# Patient Record
Sex: Female | Born: 1945 | Race: White | Hispanic: No | Marital: Married | Smoking: Never smoker
Health system: Southern US, Academic
[De-identification: ages and names within clinical notes are randomized; demographics above are authoritative.]

## PROBLEM LIST (undated history)

## (undated) DIAGNOSIS — N200 Calculus of kidney: Secondary | ICD-10-CM

## (undated) DIAGNOSIS — J189 Pneumonia, unspecified organism: Secondary | ICD-10-CM

## (undated) DIAGNOSIS — I10 Essential (primary) hypertension: Secondary | ICD-10-CM

## (undated) DIAGNOSIS — E782 Mixed hyperlipidemia: Secondary | ICD-10-CM

## (undated) DIAGNOSIS — C4490 Unspecified malignant neoplasm of skin, unspecified: Secondary | ICD-10-CM

## (undated) HISTORY — PX: HX BREAST BIOPSY: SHX20

## (undated) HISTORY — DX: Mixed hyperlipidemia: E78.2

## (undated) HISTORY — PX: SKIN CANCER EXCISION: SHX779

## (undated) HISTORY — PX: HEEL SPUR SURGERY: SHX665

## (undated) HISTORY — PX: HX TRIGGER FINGER RELEASE: 210000200

## (undated) HISTORY — PX: HX HYSTERECTOMY: SHX81

## (undated) HISTORY — DX: Essential (primary) hypertension: I10

## (undated) HISTORY — PX: NEPHROSTOMY: SHX1014

## (undated) HISTORY — PX: URETERAL STENT PLACEMENT: SHX822

---

## 1992-08-26 ENCOUNTER — Other Ambulatory Visit (HOSPITAL_COMMUNITY): Payer: Self-pay

## 2015-07-26 IMAGING — CR XRAY EXAM OF WRIST COMPLETE RT
1 series · 3 of 3 positions shown · non-contrast
Comparison: None.

Exam:   
Right wrist 3V
INDICATION: Trauma.

[Series 1: view not recorded · oblique · 0.17mm/px · 3 of 3 slices shown]
[im 1/3]
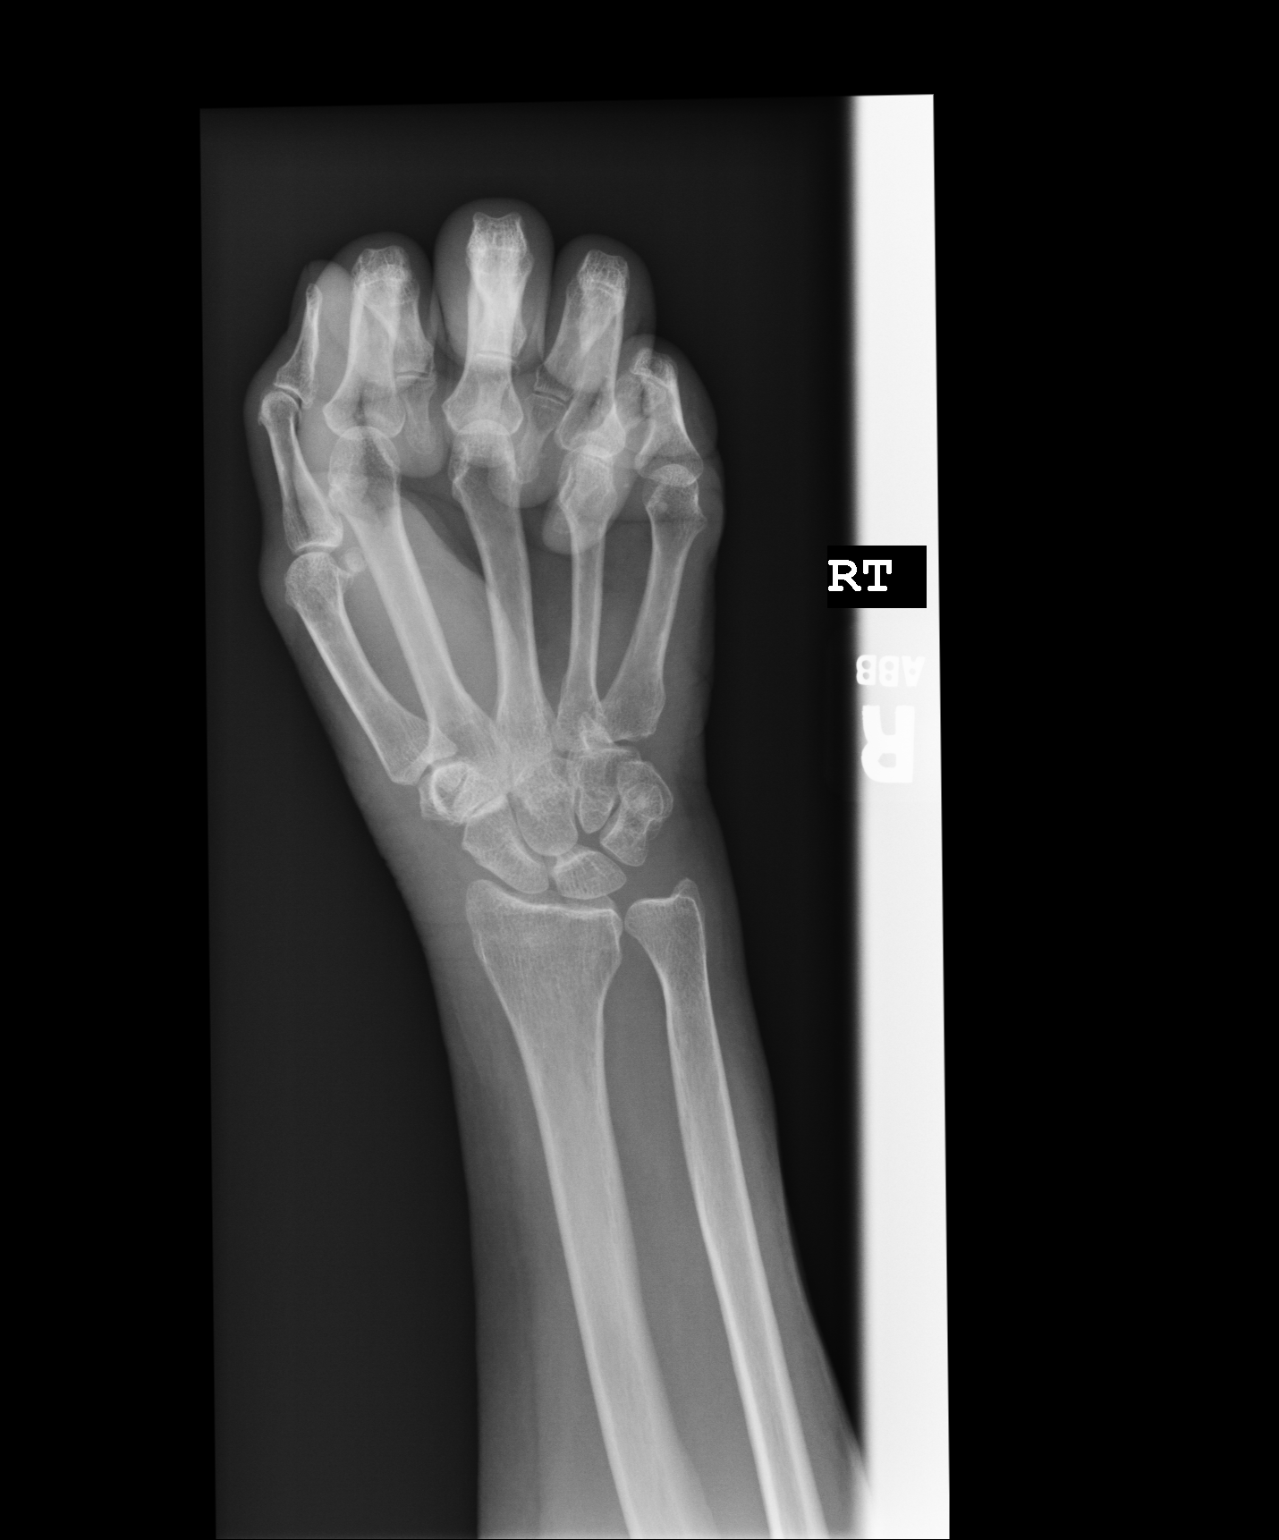
[im 2/3]
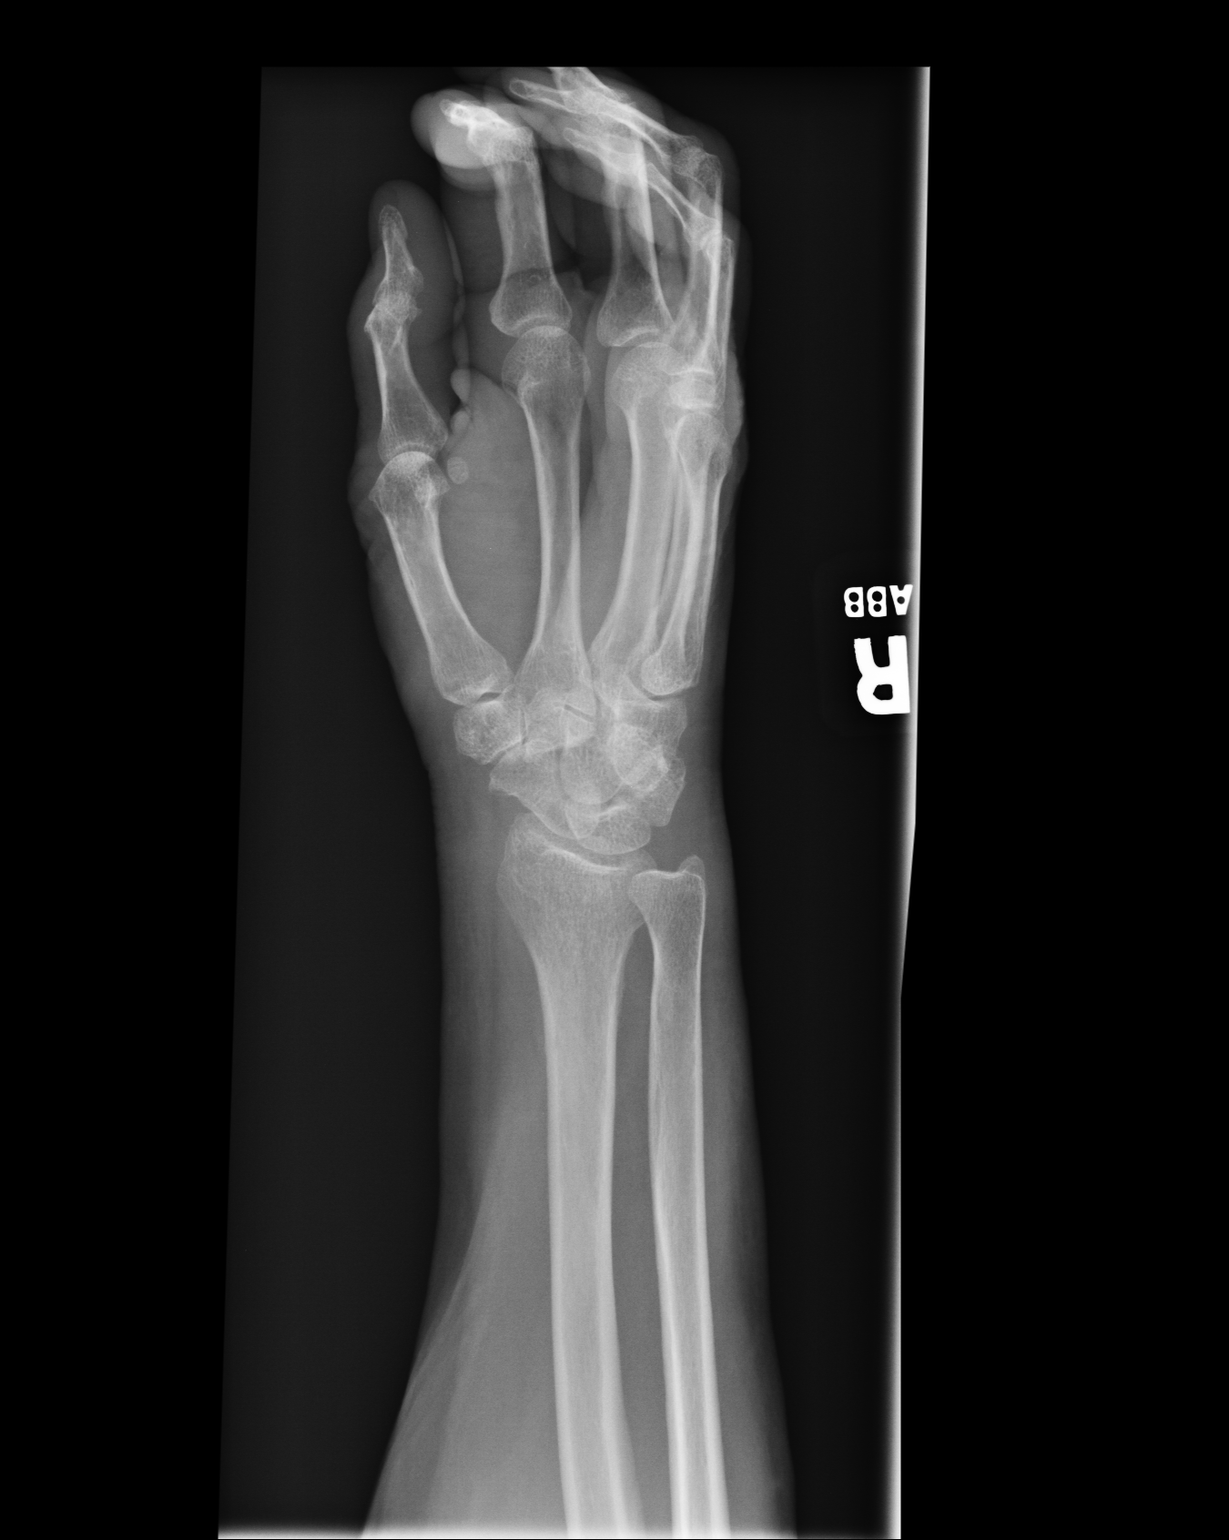
[im 3/3]
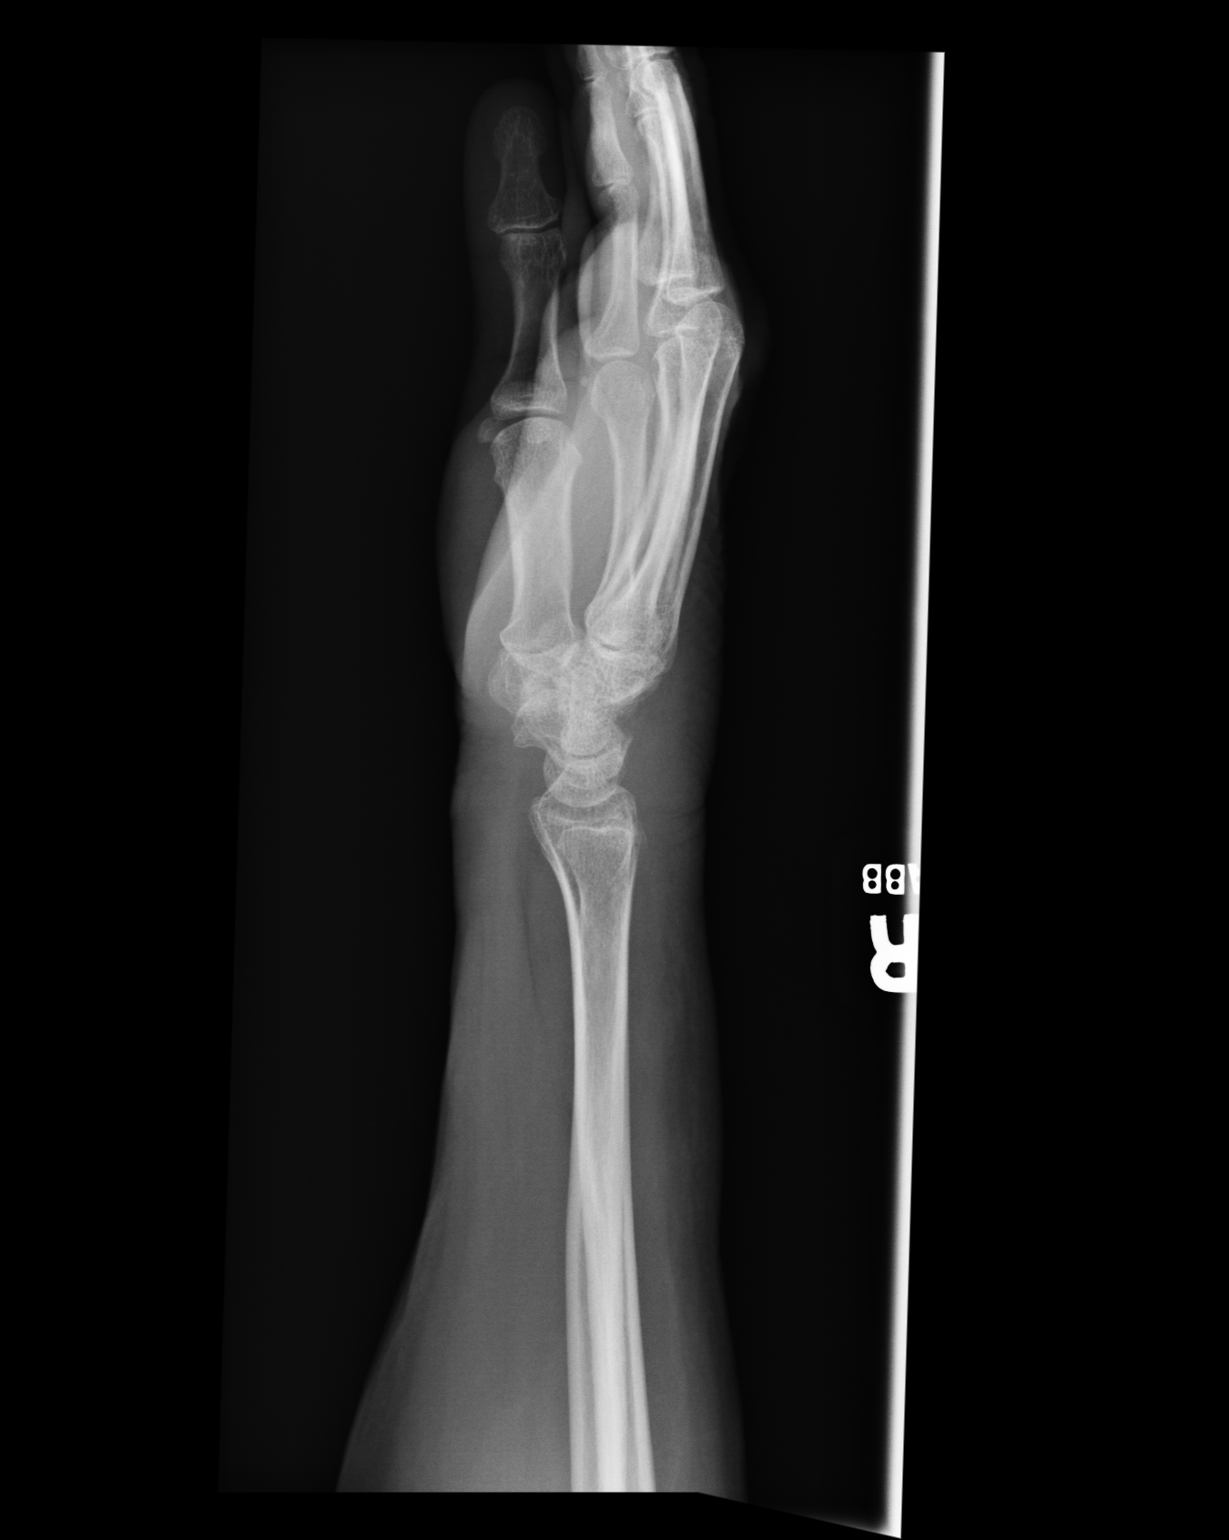

[3 of 3 positions shown; findings below may reference images not displayed]

FINDINGS: Three views of the wrist are submitted. The carpal bones are normally aligned on the lateral exam. The joint spaces are preserved. No fractures are identified.
IMPRESSION: 1.
Negative right wrist.

## 2017-04-15 IMAGING — US US HEAD AND NECK
1 series · 14 of 25 positions shown · non-contrast
Comparison: none

Exam:   

Thyroid sonogram
INDICATION: Thyroid nodule.

[Series 2: us head and neck · 0.08mm/px · 14 of 30 slices shown]
[im 1/30]
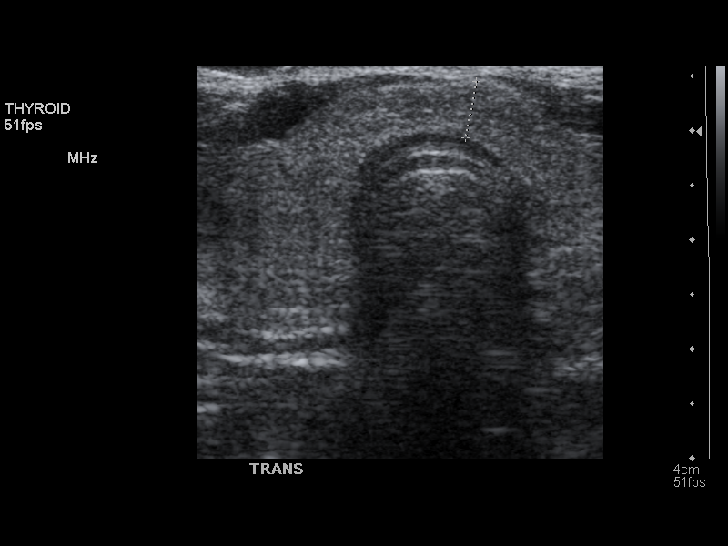
[im 3/30]
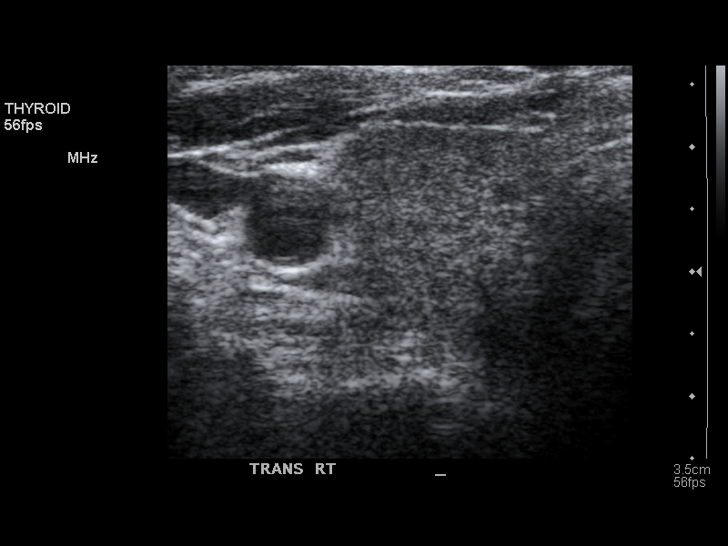
[im 5/30]
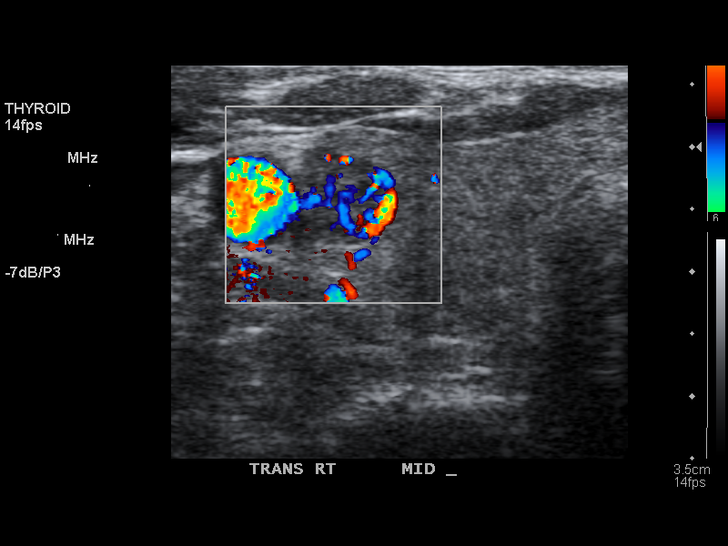
[im 8/30]
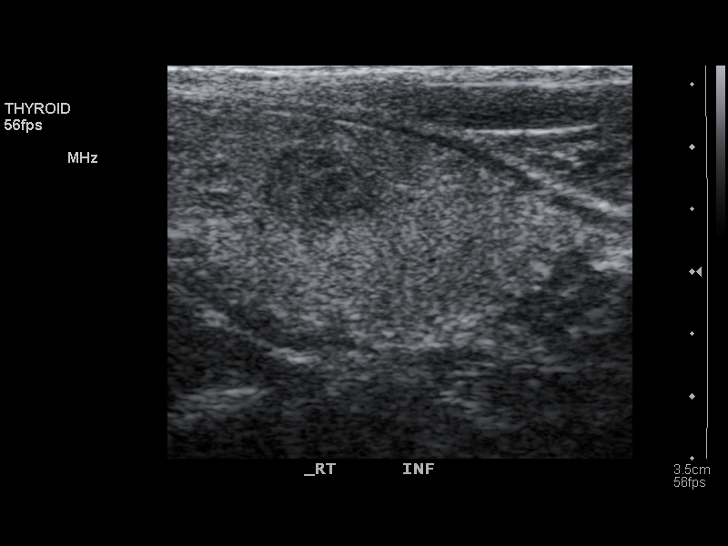
[im 10/30]
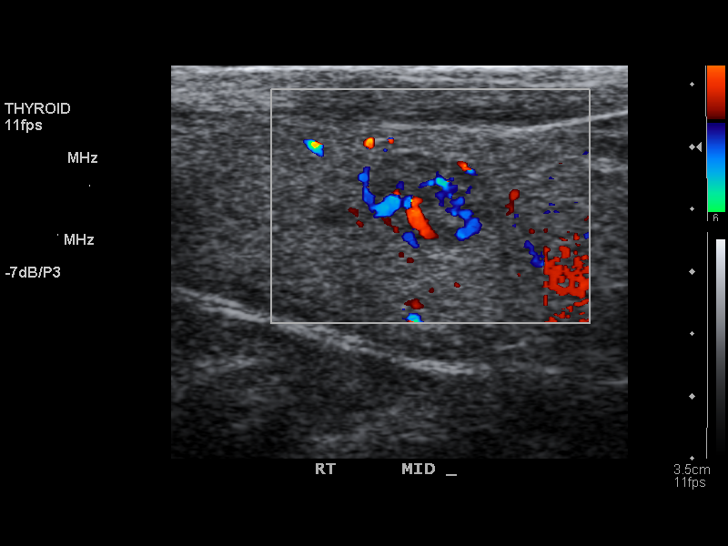
[im 11/30]
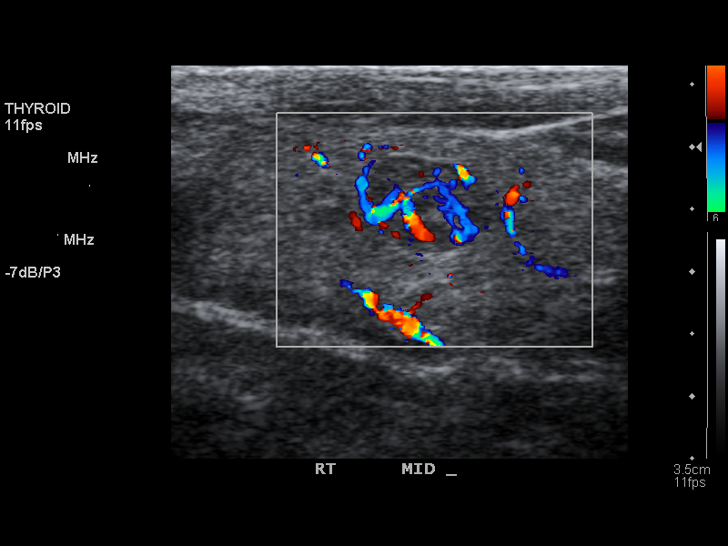
[im 14/30]
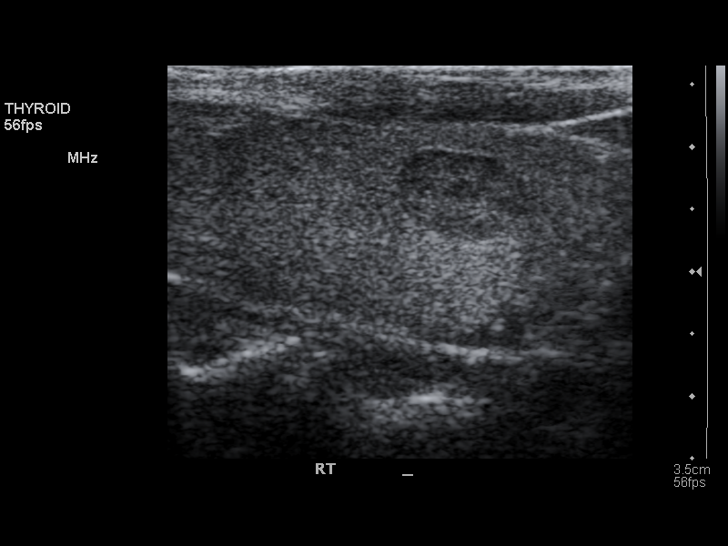
[im 16/30]
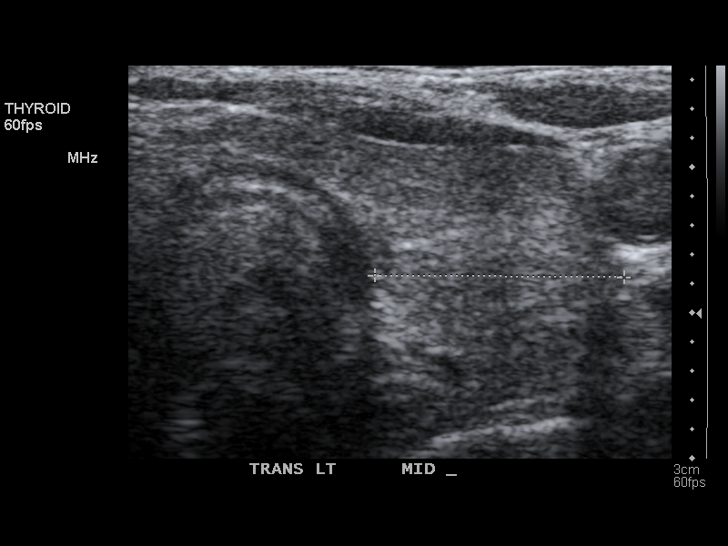
[im 19/30]
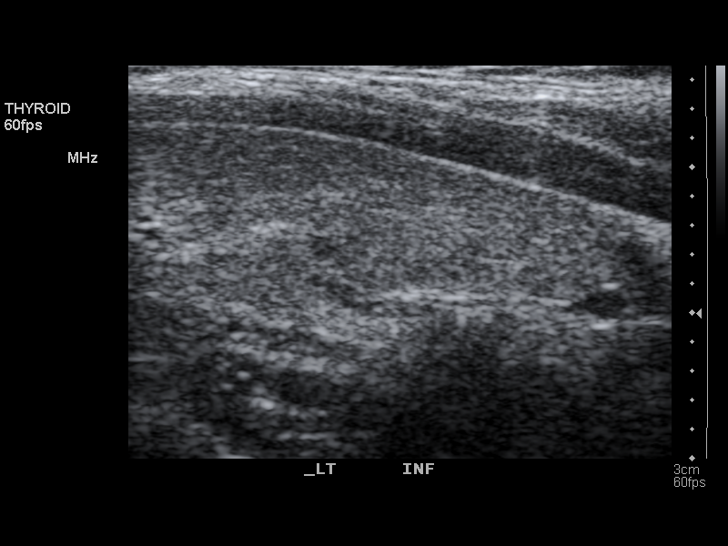
[im 20/30]
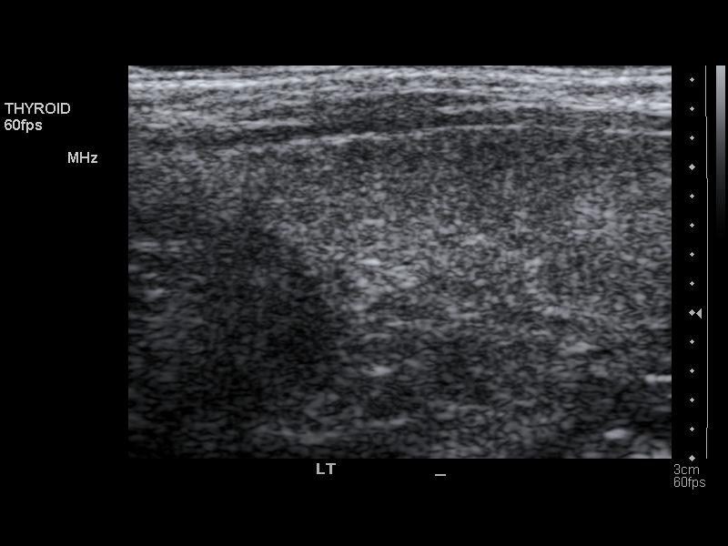
[im 22/30]
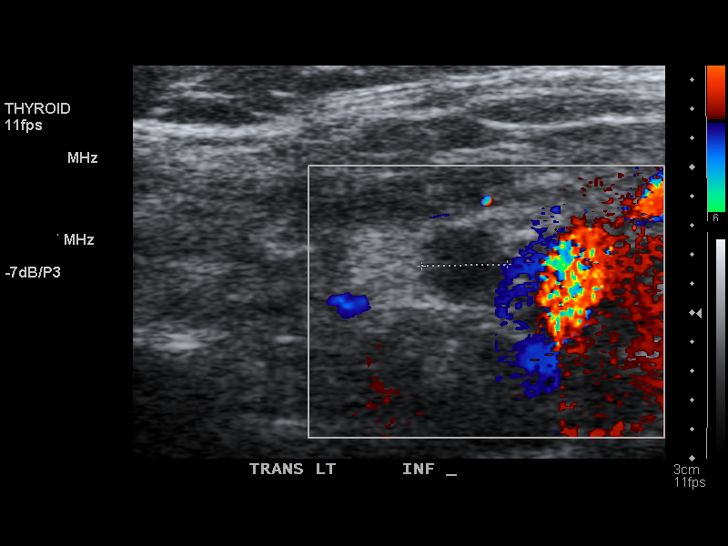
[im 25/30]
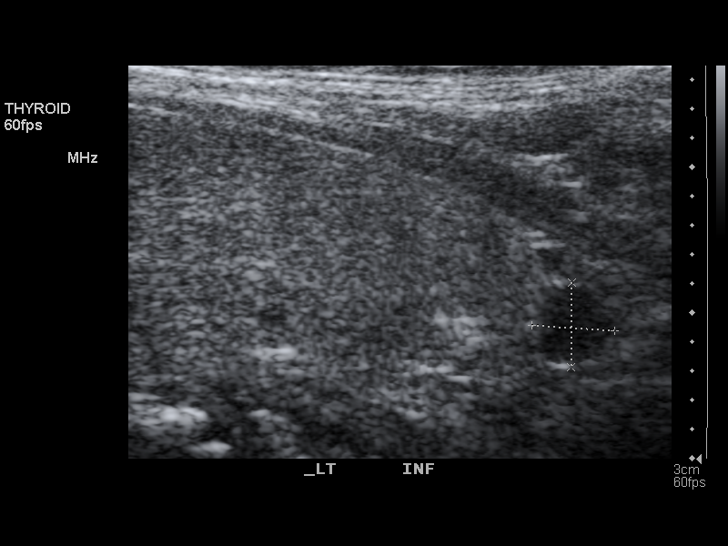
[im 27/30]
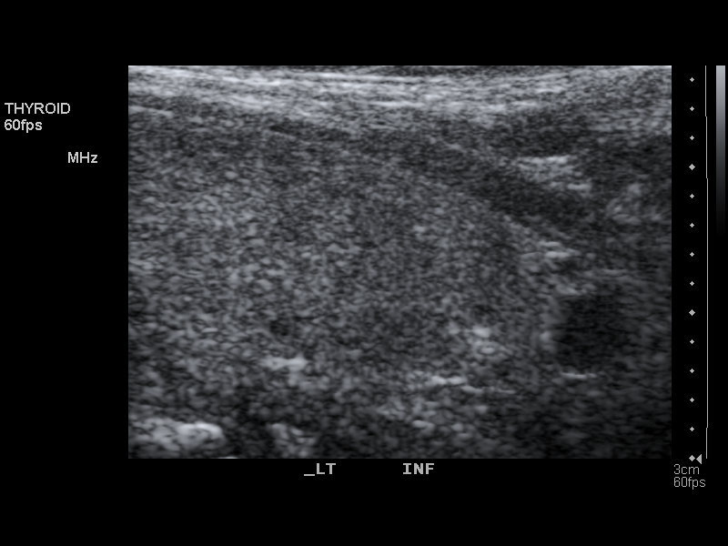
[im 30/30]
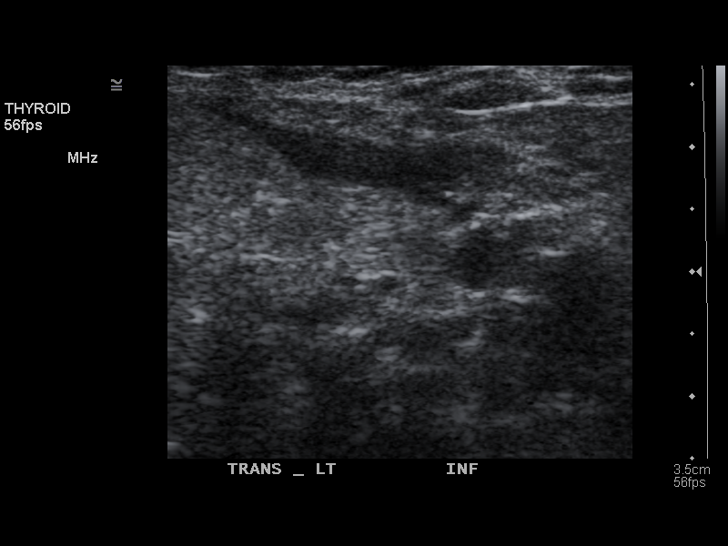

[14 of 25 positions shown; findings below may reference images not displayed]

FINDINGS: Thyroid gland is heterogeneous in echogenicity. Right lobe measures 4.4 x 1.8 x 2.1 cm and there is a 12 mm hypoechoic nodule centrally. Left lobe measures 4.7 x 1.4 x 1.7 cm and there is a 6 mm hypoechoic nodule inferiorly.
IMPRESSION: A single hypoechoic nodule within each thyroid lobe as detailed above.

## 2018-11-26 IMAGING — CR XRAY FOOT COMPLETE LT
1 series · 3 of 3 positions shown · non-contrast
Comparison: Ankle radiographs from the same day.

------------- REPORT [id number] -------------
PATIENT REGISTRATION FORM

Scheduled Modality:
DX
Date Scheduled:
Referring Physician: 
PATIENT INFORMATION
 Mr.
 Mrs.
 Miss  Ms.
Email address:
Social Security:  
Age:
Sex:
F
Mailing Address:
Home Phone
[place] [address]
Study Type:
XRAY FOOT COMPLETE LT
Diagnosis / Symptoms:
Insurance Authorization:
Notes/Special Instructions: 
INSURANCE INFORMATION
(Please give your insurance card to the receptionist.)
Name of  primary insurance
[employer]
Subscriber’s S.S. #
Group #
Insurance ID # 
Co-payment:
$
Patient’s relationship to subscriber:
 Self
 Spouse
 Child
 Other
Name of secondary insurance (if applicable):
Insurance ID #
IN CASE OF EMERGENCY
Name of friend or relative to call in case of emergency:
Relationship to patient:
Home phone #
Work phone #
The above information is true to the best of my knowledge. I authorize my insurance benefits be paid directly to the physician. I understand that I am financially responsible for any balance. I also authorize [employer] or insurance company to release any information required to process my claims.
Patient/Guardian signature
Date
------------- REPORT [id number] -------------
EXAM:  XRAY FOOT COMPLETE LT
INDICATION: Left foot pain, no trauma.

[Series 1: view not recorded · 0.17mm/px · 3 of 3 slices shown]
[im 1/3]
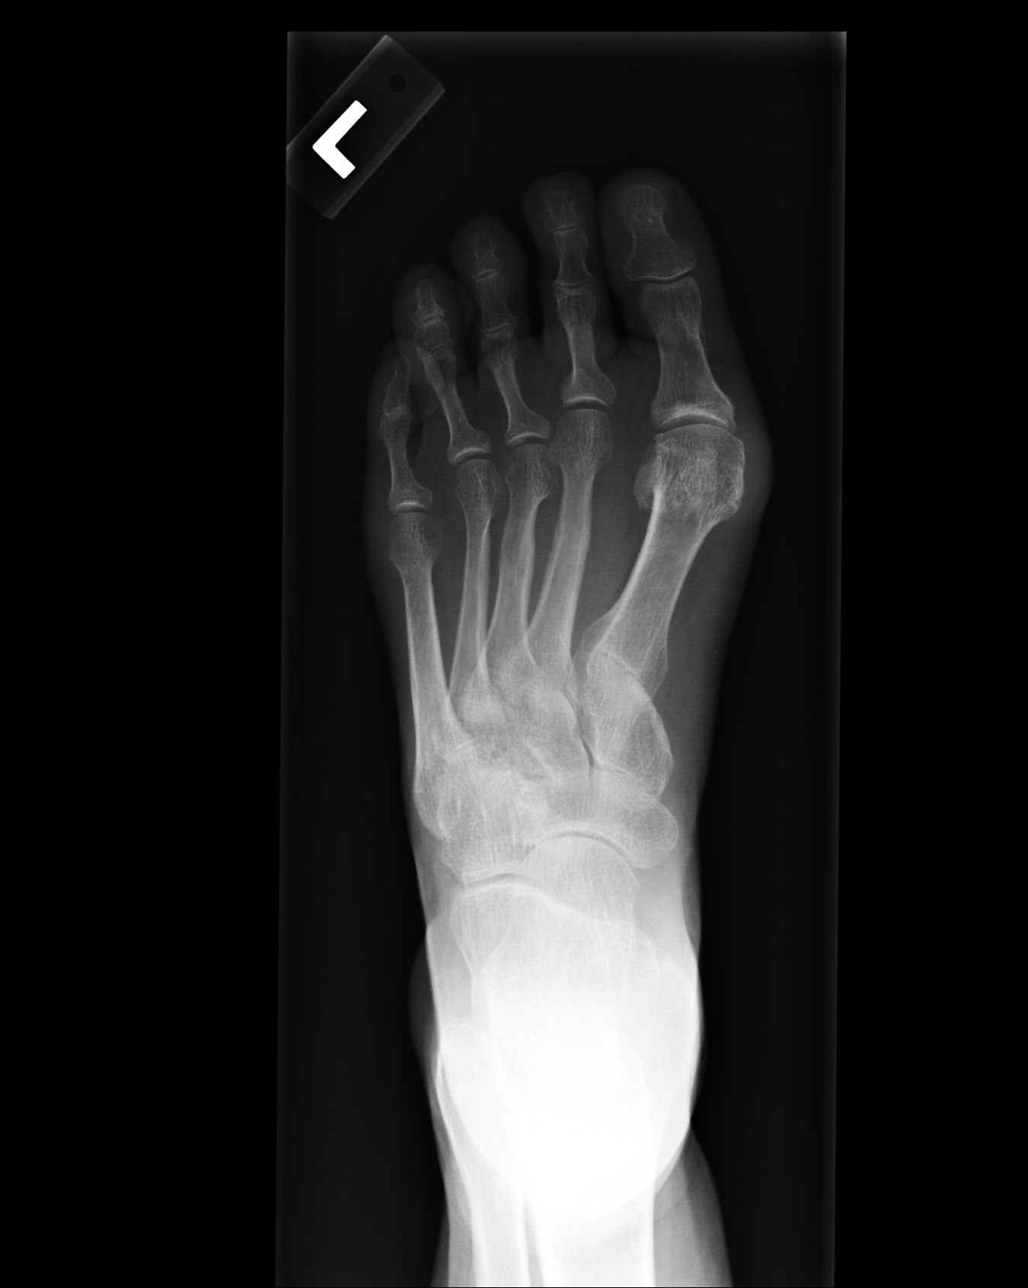
[im 2/3]
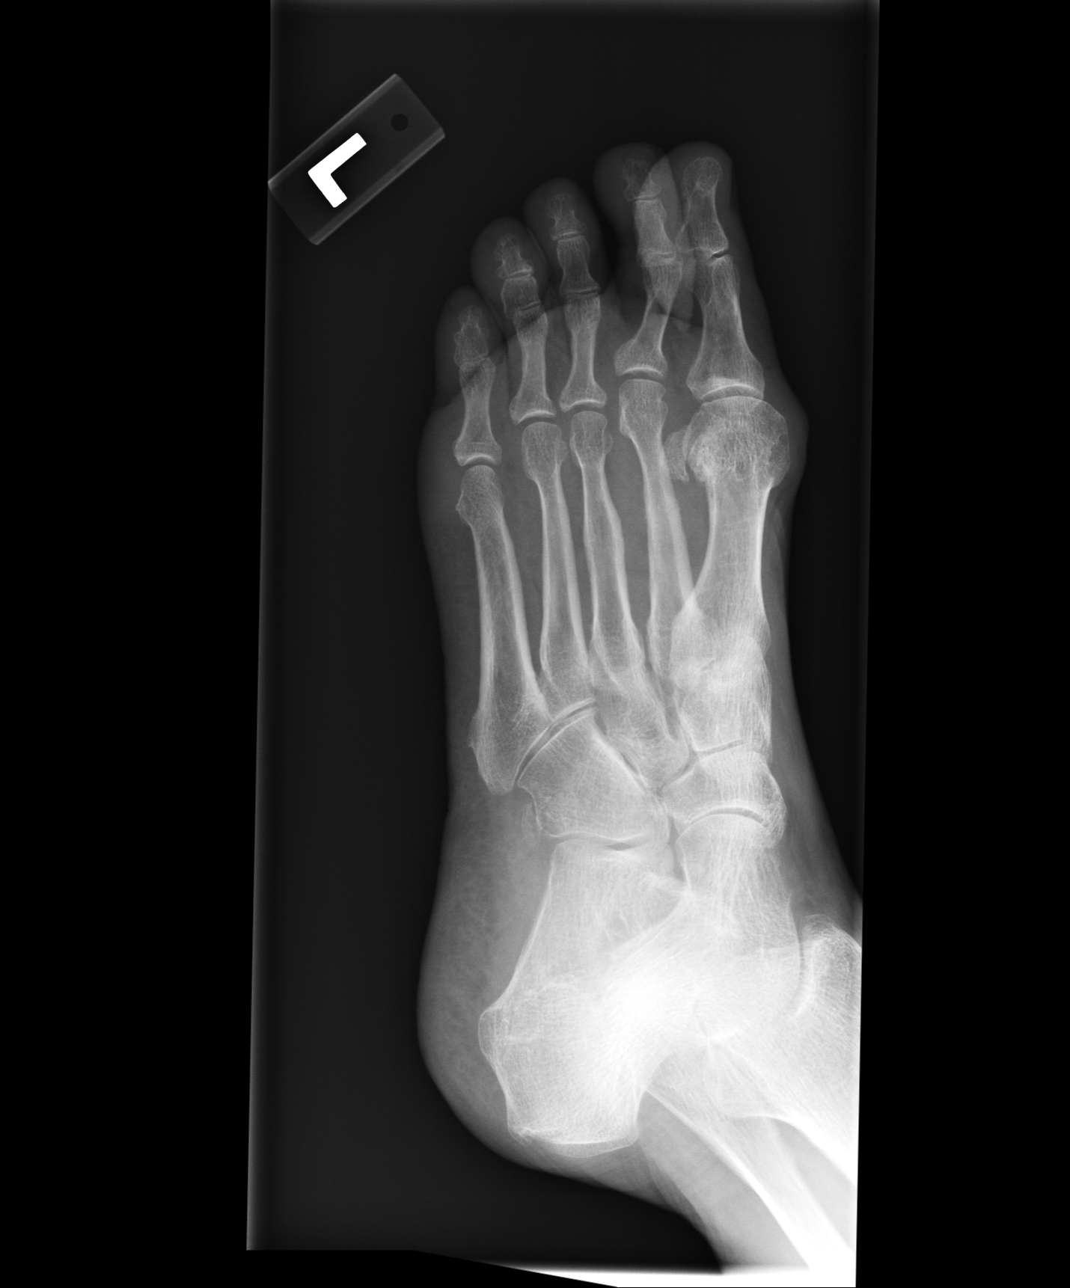
[im 3/3]
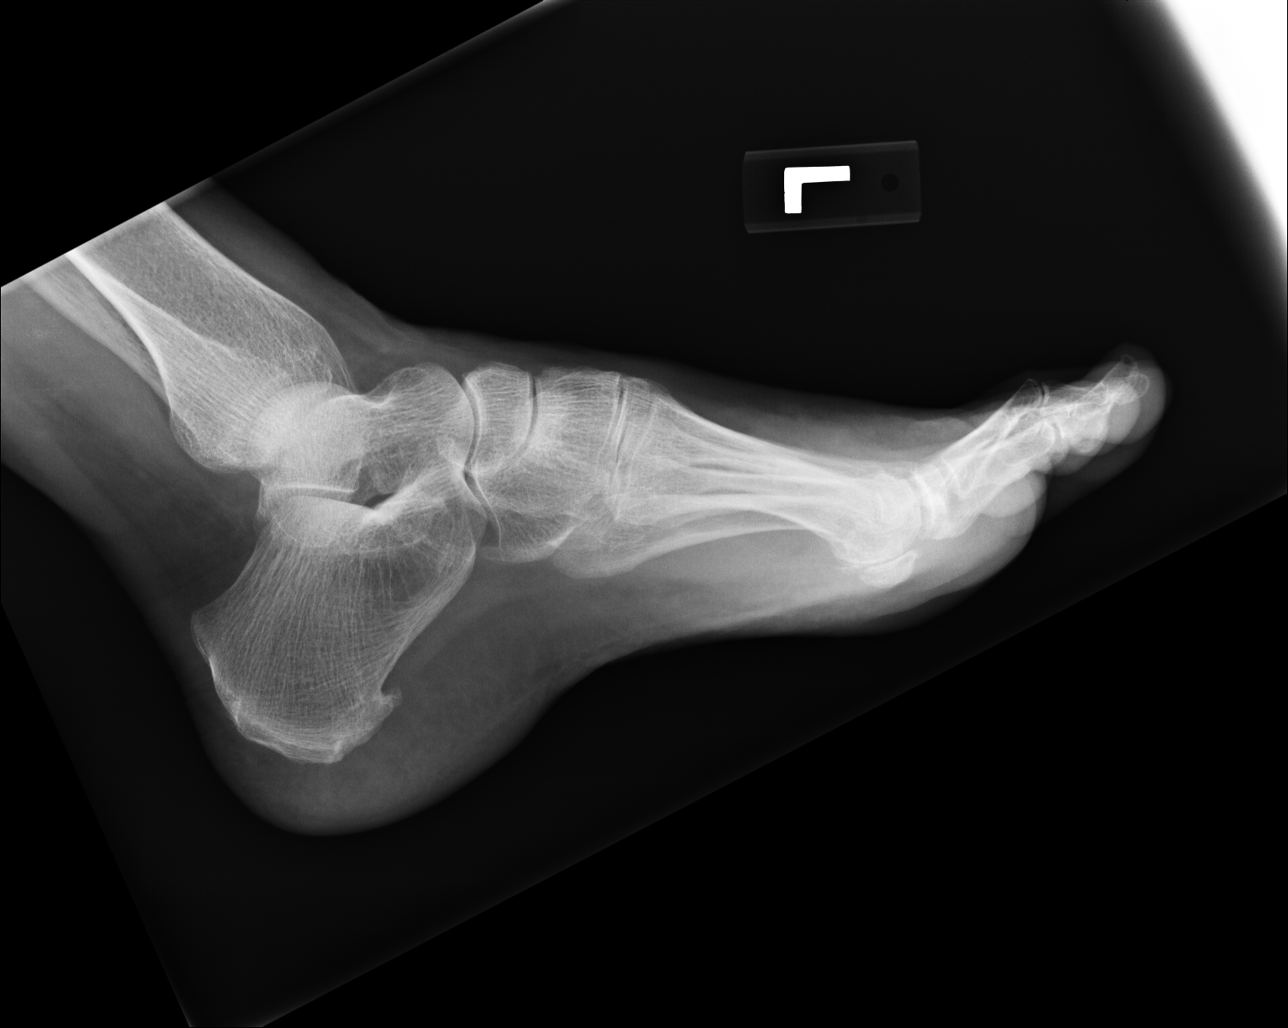

[3 of 3 positions shown; findings below may reference images not displayed]

FINDINGS: Mild hallux valgus deformity and moderate degenerative change involving the first MTP joint and hallux sesamoids.  

Small plantar spur.  

Cortical thickening along the mid/distal shafts of the second and third metatarsals and suggestion of fracture lucency involving the lateral cortex of the shaft of the second metatarsal.
IMPRESSION: Age-indeterminate non-displaced fractures involving the mid/distal shafts of the second and third metatarsals.  Further evaluation with MRI or followup radiographs is recommended. 

Mild hallux valgus deformity and moderate degenerative change involving the first MTP joint.   

I am initiating a phone call for the nondisplaced fractures involving the shafts of the second and third metatarsals.

## 2018-11-26 IMAGING — CR XRAY ANKLE COMPLETE LT
1 series · 3 of 3 positions shown · non-contrast
Comparison: Left foot radiographs from the same day.

EXAM:  XRAY ANKLE COMPLETE LT
INDICATION: Left ankle pain for about one month, no known trauma.

[Series 1: view not recorded · 0.17mm/px · 3 of 3 slices shown]
[im 1/3]
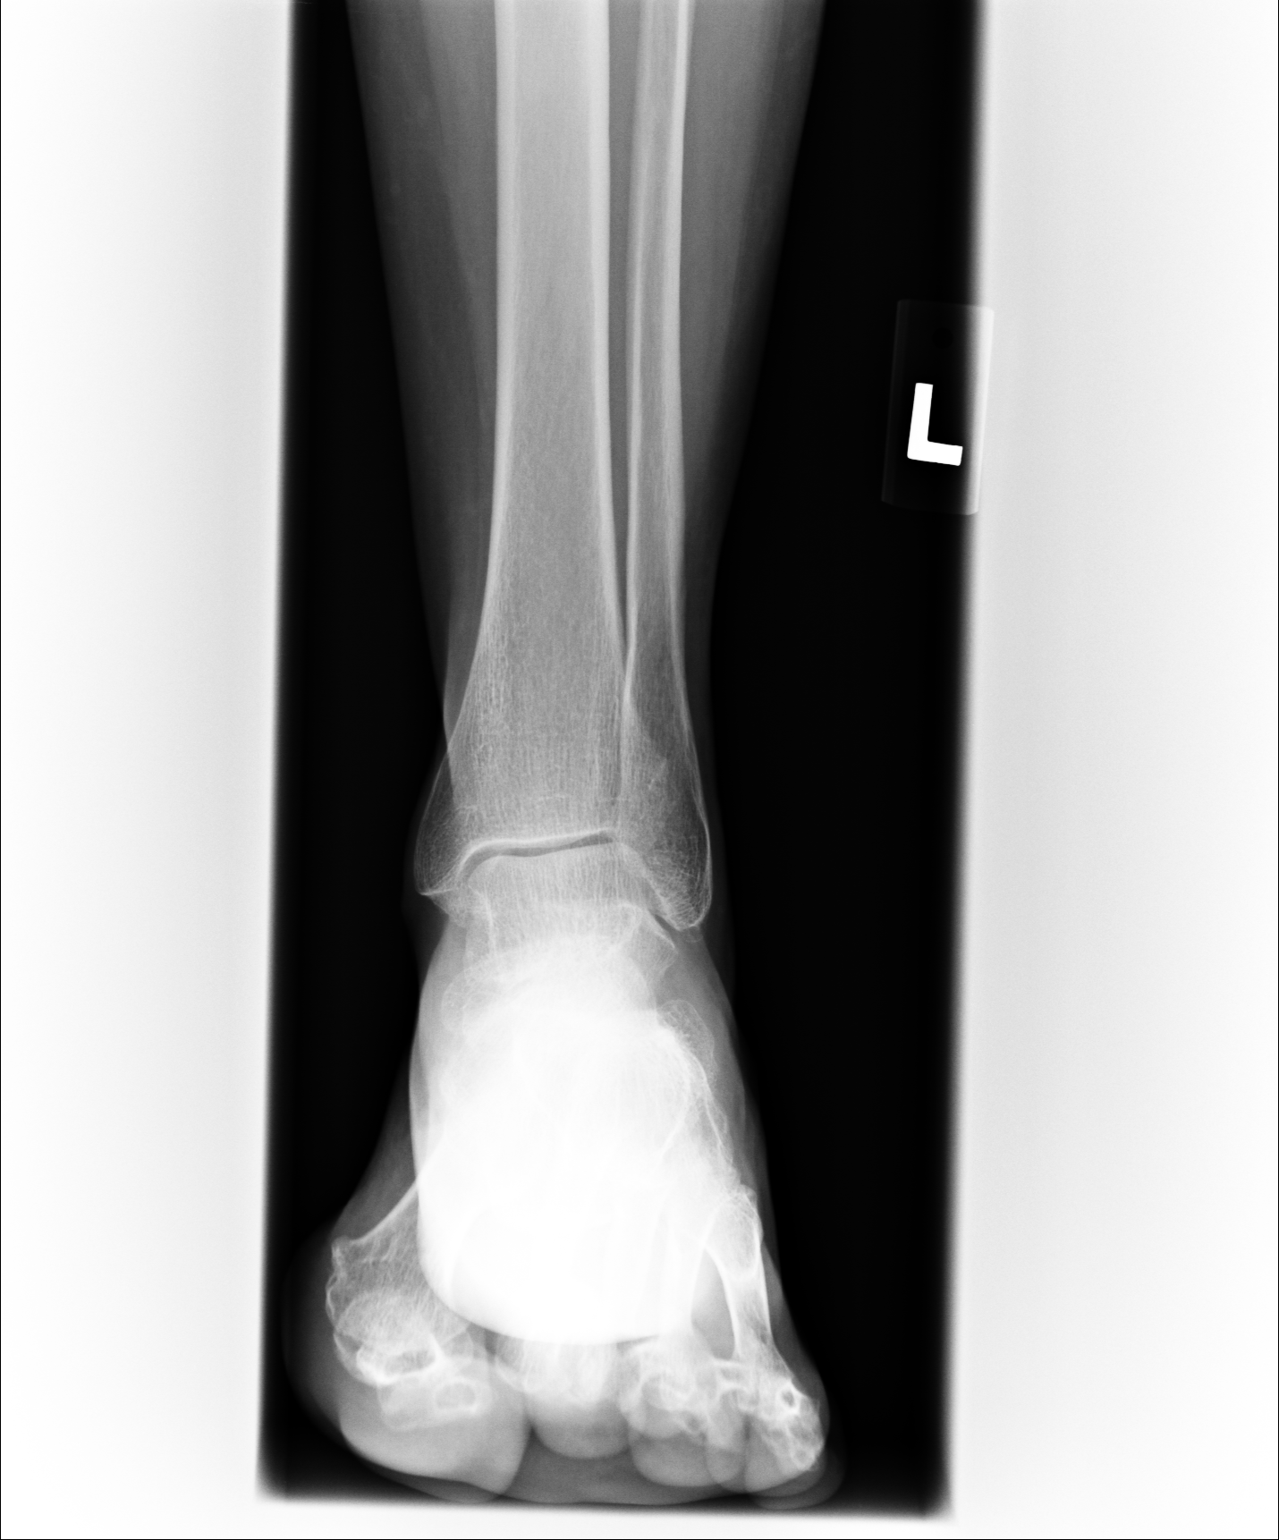
[im 2/3]
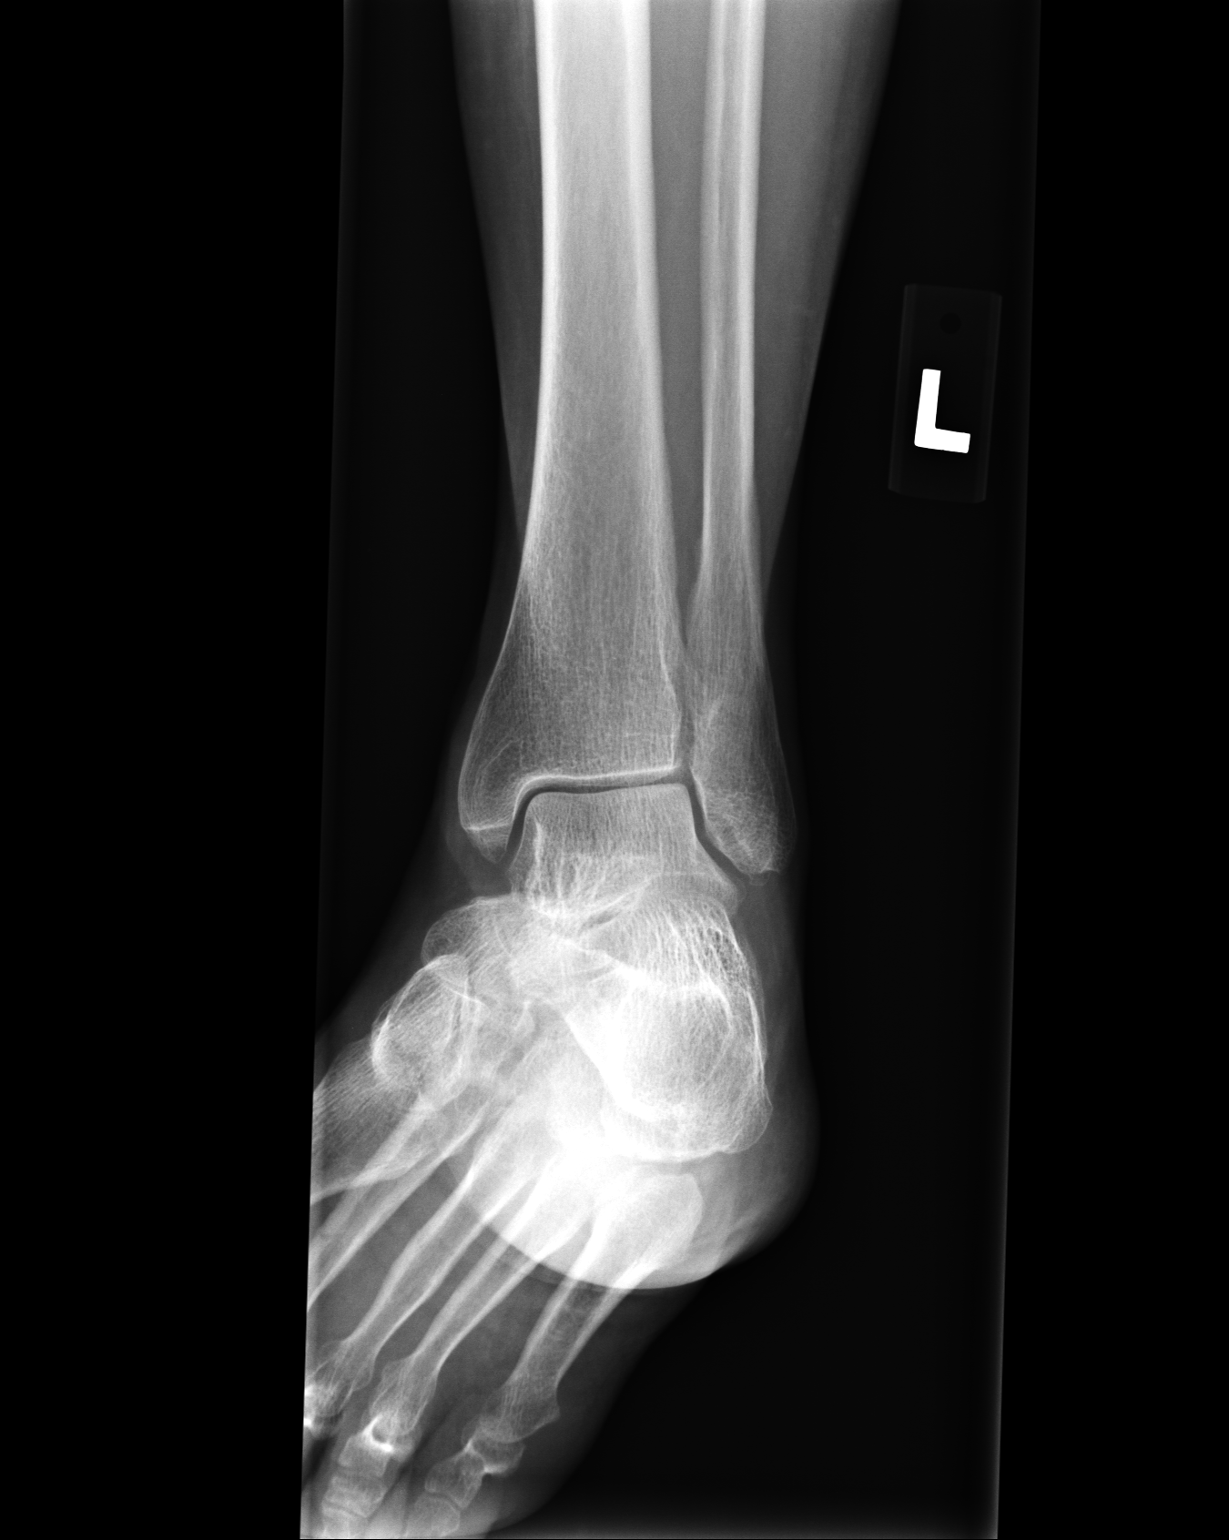
[im 3/3]
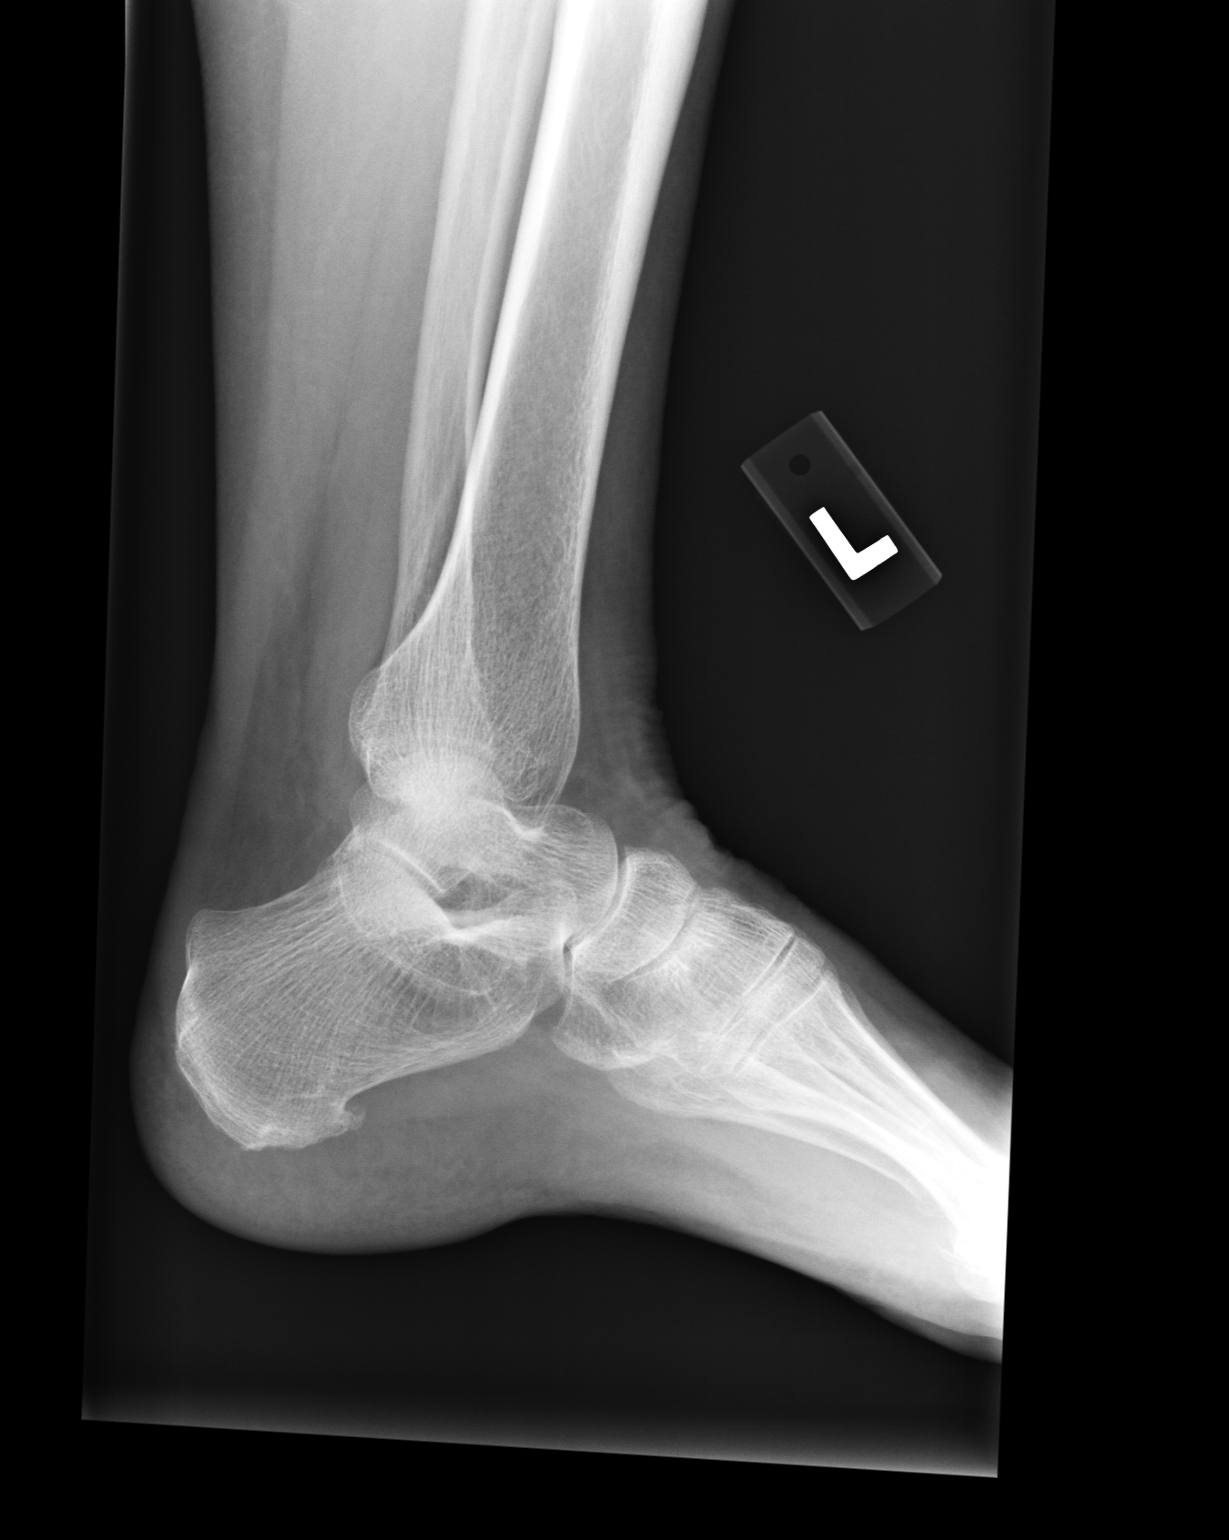

[3 of 3 positions shown; findings below may reference images not displayed]

FINDINGS: Subcentimeter ossific density which is well-corticated at the inferior lateral malleolus compatible with remote trauma.  No osteochondral abnormality identified on the talar dome.  

Small plantar spur.
IMPRESSION: Subcentimeter well-corticated ossific density projecting at the inferior lateral malleolus compatible with remote trauma.

Small plantar spur.

## 2020-07-15 IMAGING — CR XRAY KNEE 3 VIEWS RT
1 series · 3 of 3 positions shown · non-contrast
Comparison: None available.

﻿EXAM:  XRAY KNEE 3 VIEWS RT
INDICATION: Right knee pain.

[Series 1: view not recorded · 0.17mm/px · 3 of 3 slices shown]
[im 1/3]
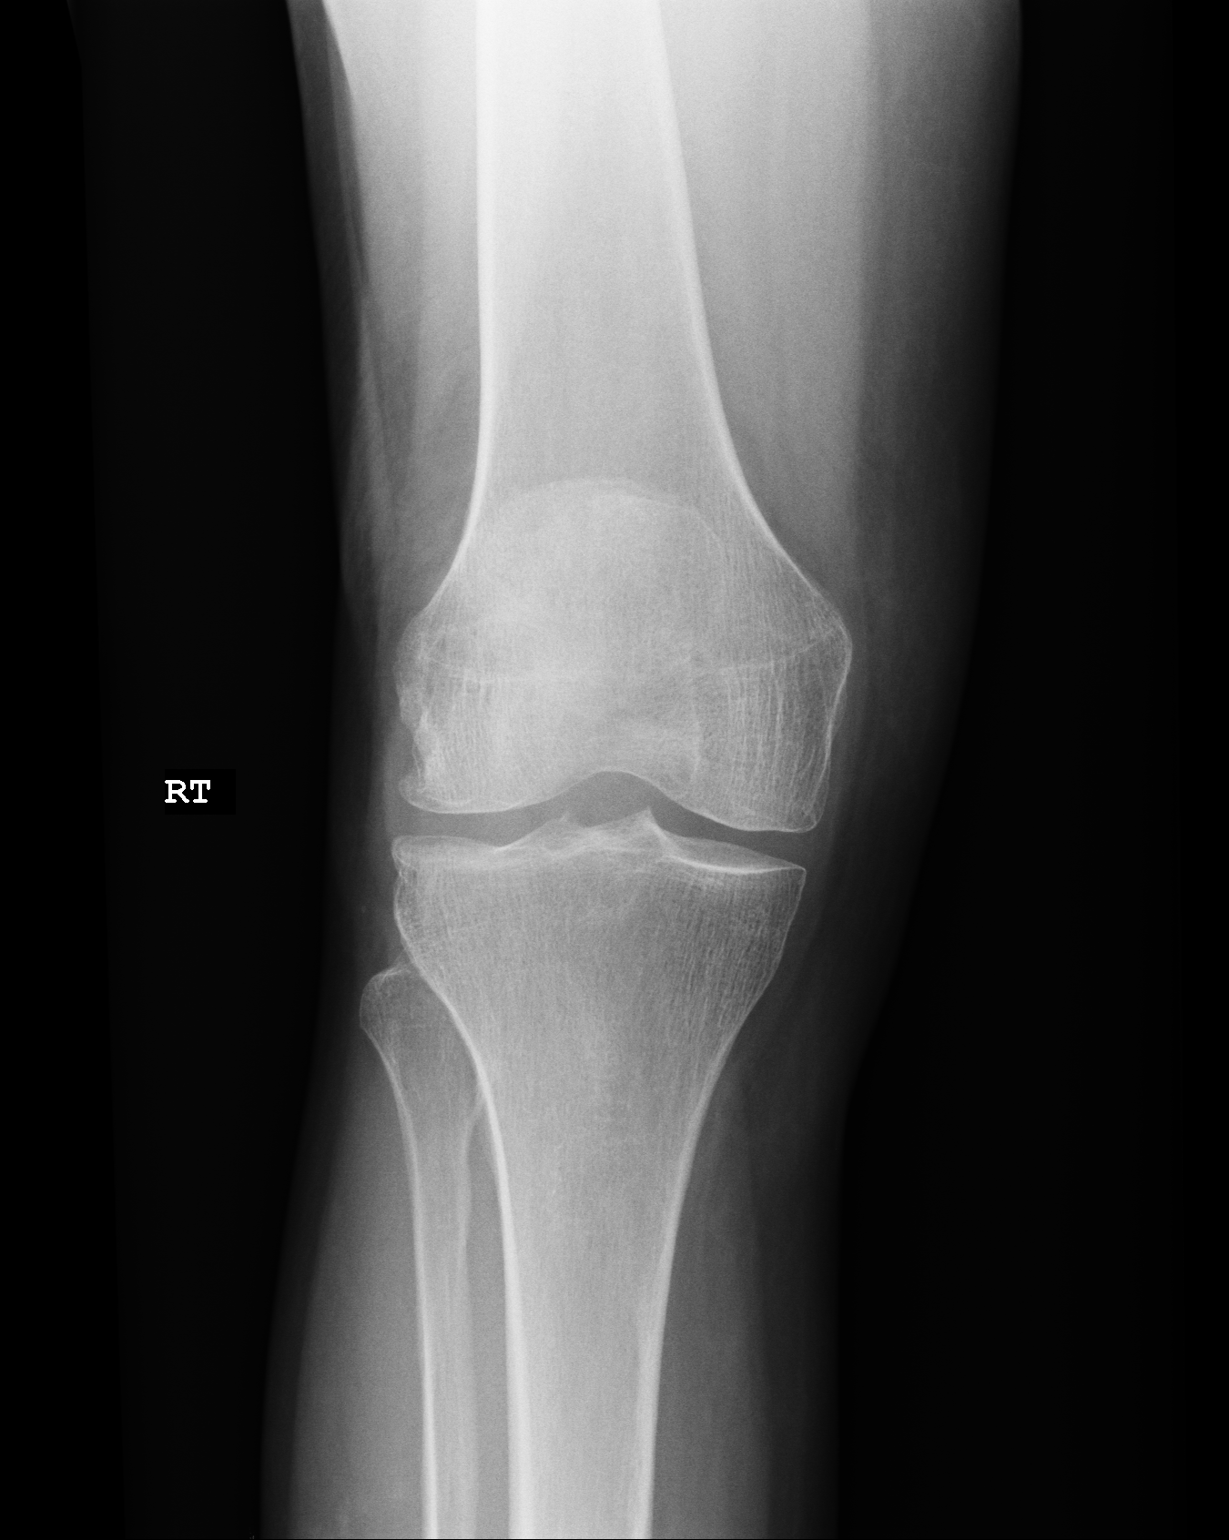
[im 2/3]
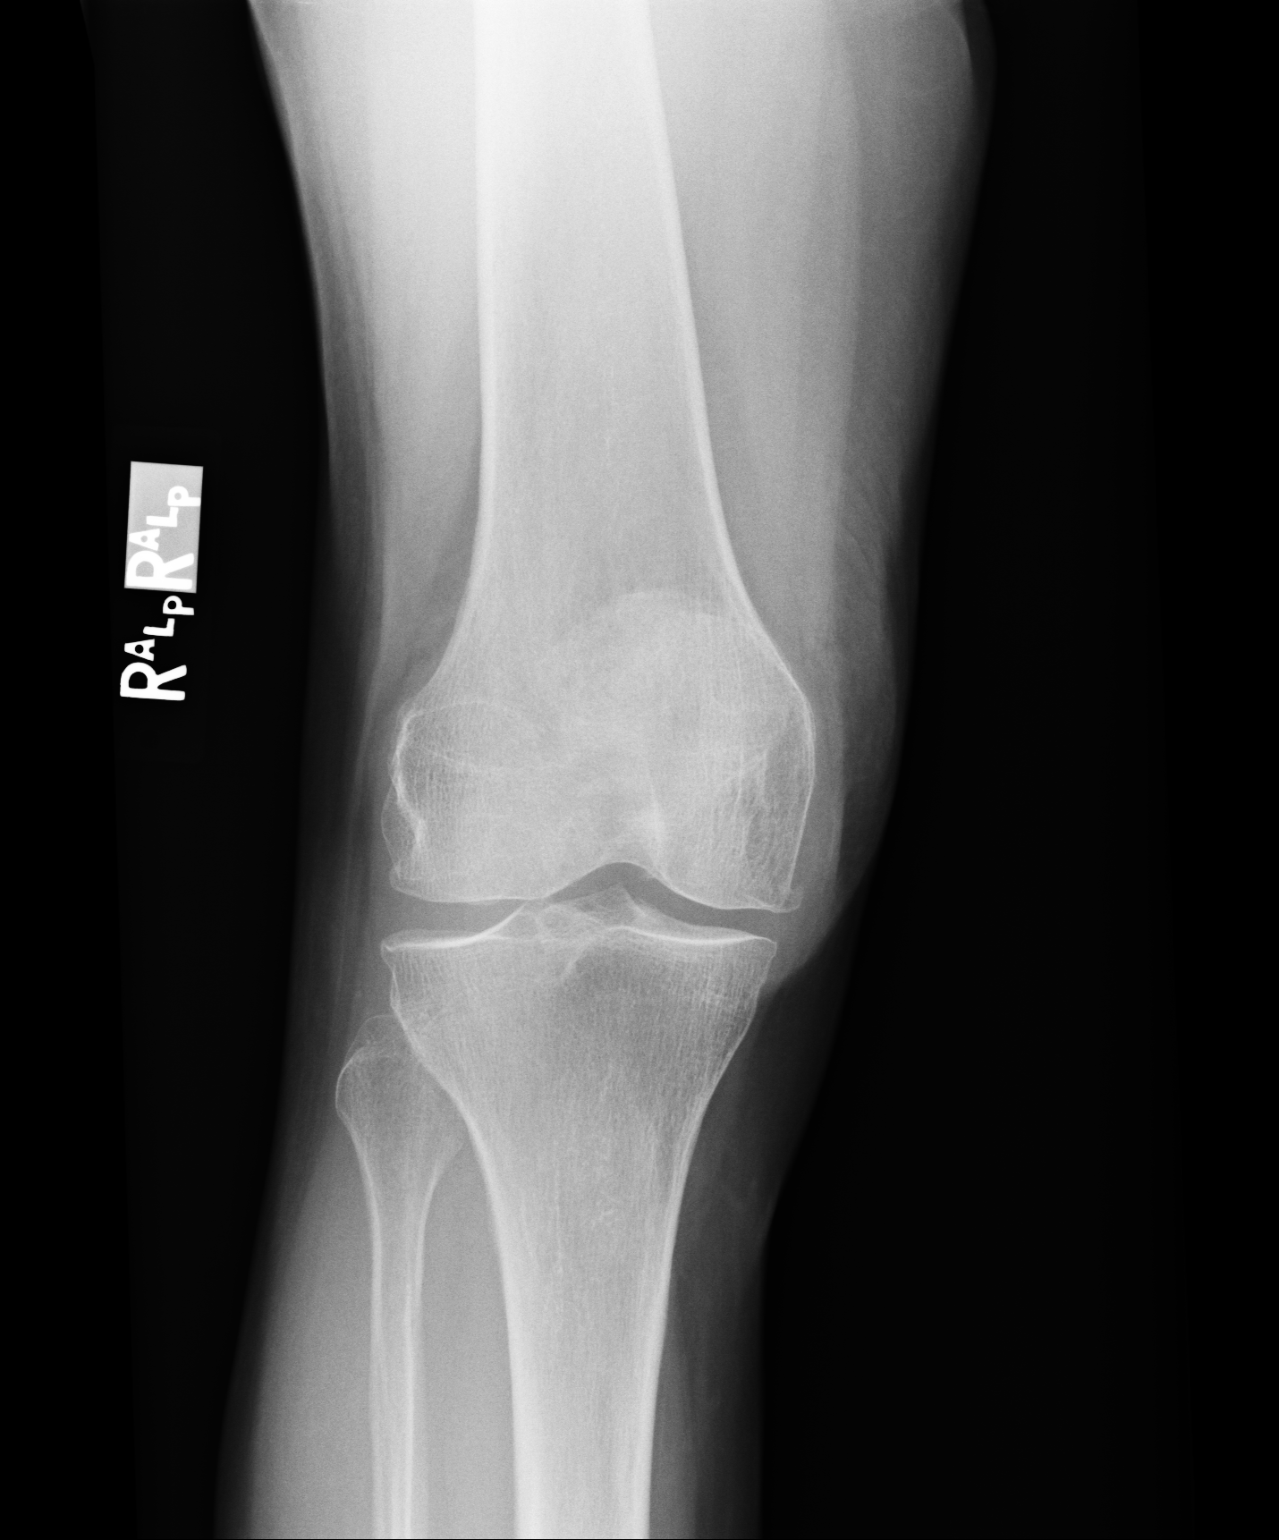
[im 3/3]
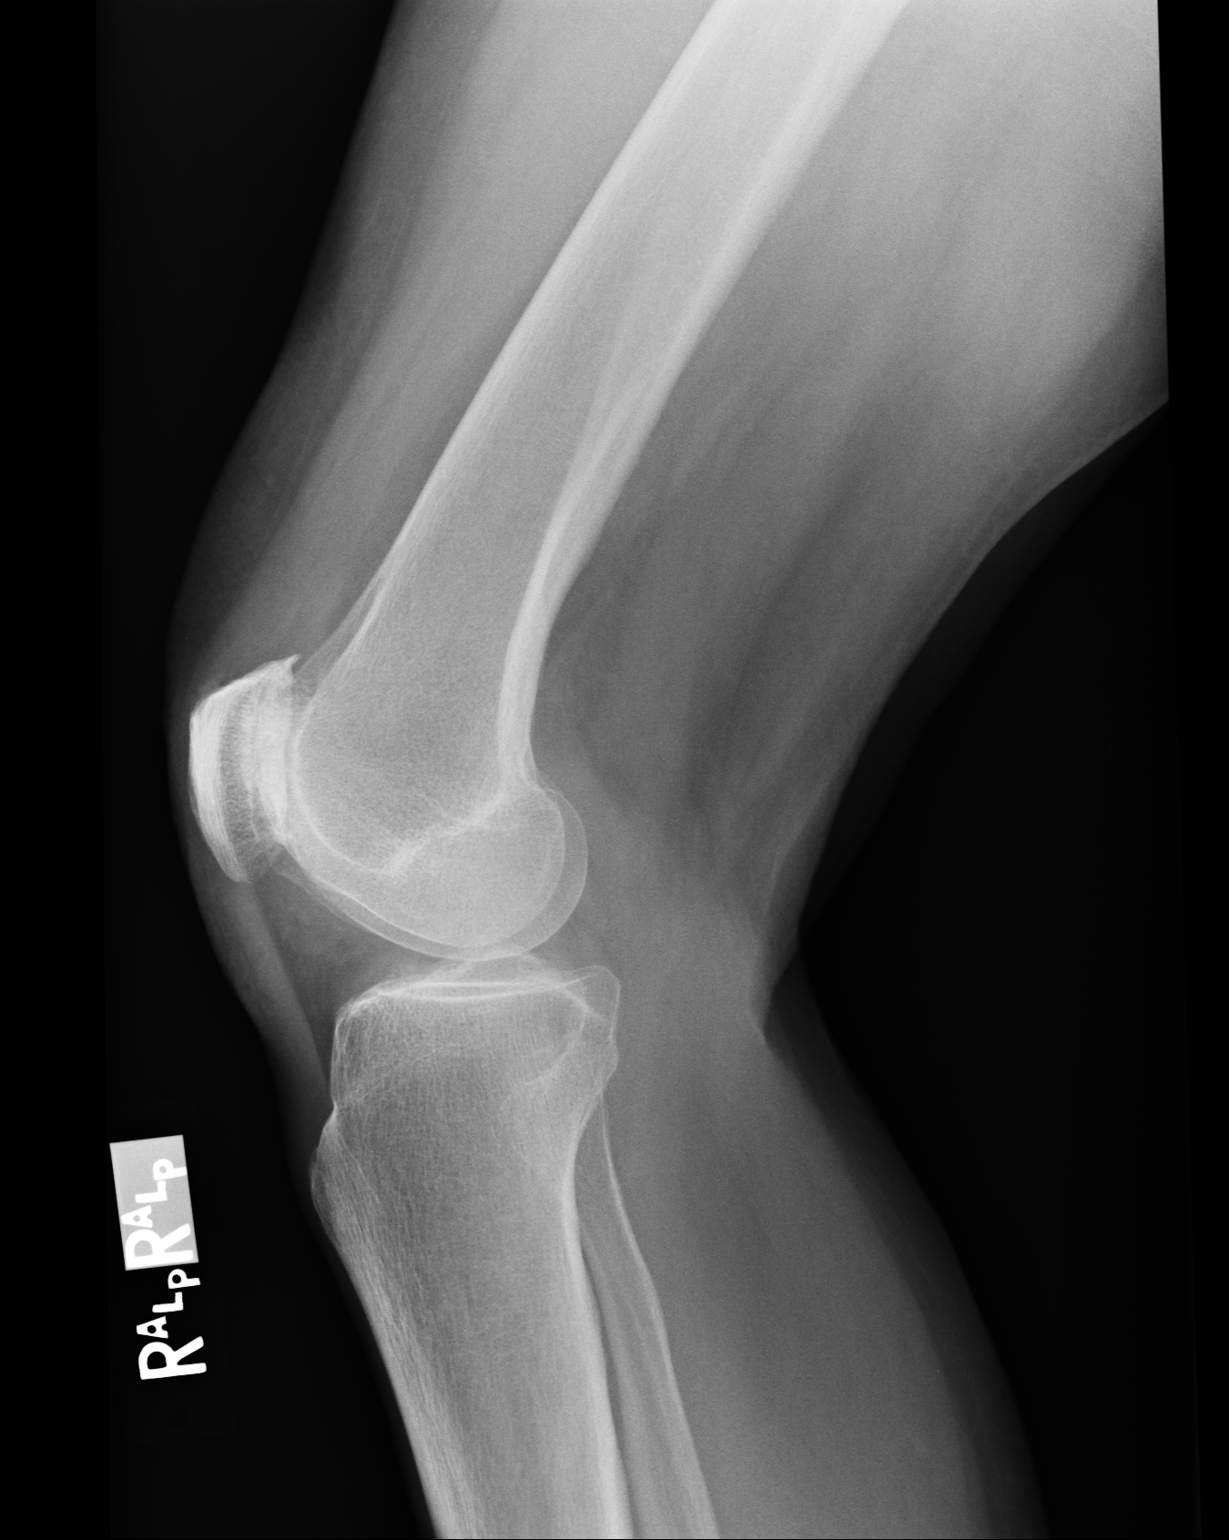

[3 of 3 positions shown; findings below may reference images not displayed]

FINDINGS: There is no acute fracture or subluxation. There is mild narrowing of the medial tibiofemoral articulation and moderate to severe patellofemoral joint space loss. There is no suprapatellar effusion. There is no soft tissue abnormality.
IMPRESSION: Osteoarthritis, no acute osseous abnormality.

## 2022-01-17 ENCOUNTER — Other Ambulatory Visit: Payer: Medicare Other | Attending: PHYSICIAN/UNDEFINED PHYSICIAN TYPE | Admitting: PHYSICIAN/UNDEFINED PHYSICIAN TYPE

## 2022-01-17 DIAGNOSIS — S81802A Unspecified open wound, left lower leg, initial encounter: Secondary | ICD-10-CM | POA: Insufficient documentation

## 2022-01-20 LAB — WOUND, SUPERFICIAL/NON-STERILE SITE, AEROBIC CULTURE AND GRAM STAIN

## 2022-01-23 ENCOUNTER — Other Ambulatory Visit: Payer: Self-pay

## 2022-01-23 ENCOUNTER — Ambulatory Visit (INDEPENDENT_AMBULATORY_CARE_PROVIDER_SITE_OTHER): Payer: Medicare Other | Admitting: Surgery

## 2022-01-23 ENCOUNTER — Encounter (INDEPENDENT_AMBULATORY_CARE_PROVIDER_SITE_OTHER): Payer: Self-pay | Admitting: Surgery

## 2022-01-23 VITALS — BP 157/99 | HR 79 | Temp 98.1°F | Resp 18 | Ht 67.0 in | Wt 140.0 lb

## 2022-01-23 DIAGNOSIS — S81802A Unspecified open wound, left lower leg, initial encounter: Secondary | ICD-10-CM

## 2022-01-23 DIAGNOSIS — Z5189 Encounter for other specified aftercare: Secondary | ICD-10-CM

## 2022-01-23 DIAGNOSIS — R6889 Other general symptoms and signs: Secondary | ICD-10-CM

## 2022-01-23 NOTE — Progress Notes (Signed)
[facility]    Progress Note    Name: Leslie Hall MRN:  [id number]   Date: 01/23/2022 Age: 76 y.o.            Date of Service:  01/23/2022  Leslie Hall, 76 y.o. female  Date of Birth:  Oct 01, 1946  PCP: Octaviano Batty, MD  Referring:  Octaviano Batty     HPI:  Leslie Hall is a 76 y.o. White female who returns for a follow up regarding her left leg wound. She has no complaints and has been continuing with wound care. She said that the scab came off.      Past Medical History:   Diagnosis Date   . Hypertension    . Mixed hyperlipidemia       Past Surgical History:   Procedure Laterality Date   . HEEL SPUR SURGERY     . HX BREAST BIOPSY     . HX HYSTERECTOMY     . HX TRIGGER FINGER RELEASE        Outpatient Medications Marked as Taking for the 01/23/22 encounter (Office Visit) with Bonny Vanleeuwen, Bruna Potter, MD   Medication Sig   . aspirin (ECOTRIN) 81 mg Oral Tablet, Delayed Release (E.C.) Take by mouth Once a day   . coenzyme Q10 30 mg Oral Capsule Take 1 Capsule (30 mg total) by mouth Every evening with dinner   . Flaxseed Oil Oil    . KRILL OIL ORAL Take by mouth   . montelukast (SINGULAIR) 10 mg Oral Tablet Take 1 Tablet (10 mg total) by mouth Once a day   . Red Yeast Rice Extract 600 mg Oral Capsule Take by mouth   . valsartan (DIOVAN) 160 mg Oral Tablet Take 1 Tablet (160 mg total) by mouth Once a day   . vitamin B complex Oral Tablet Take 1 Tablet by mouth Once a day      Allergies   Allergen Reactions   . Mobic [Meloxicam]    . Niacin    . Penicillins            BP (!) 157/99   Pulse 79   Temp 36.7 C (98.1 F)   Resp 18   Ht 1.702 m ('5\' 7"'$ )   Wt 63.5 kg (140 lb)   SpO2 94%   BMI 21.93 kg/m          General: appropriate for age. in no acute distress.    Vital signs are present above and have been reviewed by me     HEENT: Atraumatic, Normocephalic.    Lungs: Nonlabored breathing with symmetric expansion    Heart:Regular wth respect to rate and  rythmn.    Abdomen:Soft. Nontender. Nondistended and benign    Examination of the left leg shows healing nicely inhalation tissue.  No evidence of infection.  The patient does have some hyperemia around the leg along with associated slight venous stasis    Psychiatric: Alert and oriented to person, place, and time. affect appropriate       Assessment/Plan:  No diagnosis found.     Left leg wound healing well  Continued care and pain area with Betadine and covered  Return to clinic 8 weeks    No follow-ups on file.     This note was partially created using voice recognition software and is inherently subject to errors including those of syntax and "sound alike " substitutions which may escape proof reading. In  such instances, original meaning may be extrapolated by contextual derivation.    Corrigan Kretschmer B Katherin Ramey, MD,MBA,FACS

## 2022-02-19 ENCOUNTER — Other Ambulatory Visit (HOSPITAL_COMMUNITY): Payer: Self-pay | Admitting: PHYSICIAN/UNDEFINED PHYSICIAN TYPE

## 2022-02-19 ENCOUNTER — Other Ambulatory Visit: Payer: Self-pay

## 2022-02-19 ENCOUNTER — Inpatient Hospital Stay
Admission: RE | Admit: 2022-02-19 | Discharge: 2022-02-19 | Disposition: A | Discharge status: Home / Self Care | Payer: Medicare Other | Source: Ambulatory Visit | Attending: PHYSICIAN/UNDEFINED PHYSICIAN TYPE | Admitting: PHYSICIAN/UNDEFINED PHYSICIAN TYPE

## 2022-02-19 DIAGNOSIS — M542 Cervicalgia: Secondary | ICD-10-CM

## 2022-02-19 DIAGNOSIS — M25511 Pain in right shoulder: Secondary | ICD-10-CM | POA: Insufficient documentation

## 2022-03-20 ENCOUNTER — Other Ambulatory Visit: Payer: Self-pay

## 2022-03-20 ENCOUNTER — Encounter (INDEPENDENT_AMBULATORY_CARE_PROVIDER_SITE_OTHER): Payer: Self-pay | Admitting: Surgery

## 2022-03-20 ENCOUNTER — Ambulatory Visit (INDEPENDENT_AMBULATORY_CARE_PROVIDER_SITE_OTHER): Payer: Medicare Other | Admitting: Surgery

## 2022-03-20 VITALS — BP 178/103 | HR 82 | Temp 97.8°F | Resp 18 | Ht 67.0 in | Wt 144.0 lb

## 2022-03-20 DIAGNOSIS — Z5189 Encounter for other specified aftercare: Secondary | ICD-10-CM

## 2022-03-20 NOTE — Progress Notes (Signed)
[facility]    Progress Note    Name: Leslie Hall MRN:  [id number]   Date: 03/20/2022 Age: 75 y.o.            Date of Service:  03/20/2022  Leslie Hall, 76 y.o. female  Date of Birth:  20-Dec-1945  PCP: Octaviano Batty, MD  Referring:  Octaviano Batty     HPI:  Leslie Hall is a 76 y.o. White female who returns for a wound check regarding her left leg.  She had sustained a traumatic wound months ago and it has taken this long to completely heal.  She has no complaints on this visit          Past Medical History:   Diagnosis Date   . Hypertension    . Mixed hyperlipidemia       Past Surgical History:   Procedure Laterality Date   . HEEL SPUR SURGERY     . HX BREAST BIOPSY     . HX HYSTERECTOMY     . HX TRIGGER FINGER RELEASE        Outpatient Medications Marked as Taking for the 03/20/22 encounter (Office Visit) with Cristofer Yaffe, Bruna Potter, MD   Medication Sig   . aspirin (ECOTRIN) 81 mg Oral Tablet, Delayed Release (E.C.) Take by mouth Once a day   . coenzyme Q10 30 mg Oral Capsule Take 1 Capsule (30 mg total) by mouth Every evening with dinner   . Flaxseed Oil Oil    . KRILL OIL ORAL Take by mouth   . montelukast (SINGULAIR) 10 mg Oral Tablet Take 1 Tablet (10 mg total) by mouth Once a day   . Red Yeast Rice Extract 600 mg Oral Capsule Take by mouth   . valsartan (DIOVAN) 160 mg Oral Tablet Take 1 Tablet (160 mg total) by mouth Once a day   . vitamin B complex Oral Tablet Take 1 Tablet by mouth Once a day      Allergies   Allergen Reactions   . Mobic [Meloxicam]    . Niacin    . Penicillins            BP (!) 178/103   Pulse 82   Temp 36.6 C (97.8 F)   Resp 18   Ht 1.702 m ('5\' 7"'$ )   Wt 65.3 kg (144 lb)   SpO2 100%   BMI 22.55 kg/m          General: appropriate for age. in no acute distress.    Vital signs are present above and have been reviewed by me     HEENT: Atraumatic, Normocephalic.    Lungs: Nonlabored breathing with symmetric  expansion    Heart:Regular wth respect to rate and rythmn.    Abdomen:Soft. Nontender. Nondistended and benign    Extremities:  The left lower extremity shows that the wound is healed completely and then this no residual swelling.  The patient does have some brawny discoloration associated with the trauma and probable slight venous insufficiency.  Good tissue perfusion is noted.    Psychiatric: Alert and oriented to person, place, and time. affect appropriate       Assessment/Plan:  Assessment/Plan   1. Visit for wound check         Continue local care and apply moisturizing lotion daily.  No further surgical intervention is required.  The patient was very appreciative of the care given    Return if symptoms  worsen or fail to improve.     This note was partially created using voice recognition software and is inherently subject to errors including those of syntax and "sound alike " substitutions which may escape proof reading. In such instances, original meaning may be extrapolated by contextual derivation.    Brylee Mcgreal B Kherington Meraz, MD,MBA,FACS

## 2022-06-19 ENCOUNTER — Other Ambulatory Visit (HOSPITAL_COMMUNITY): Payer: Self-pay | Admitting: PHYSICIAN/UNDEFINED PHYSICIAN TYPE

## 2022-06-19 DIAGNOSIS — Z1239 Encounter for other screening for malignant neoplasm of breast: Secondary | ICD-10-CM

## 2022-06-29 ENCOUNTER — Other Ambulatory Visit: Payer: Self-pay

## 2022-06-29 ENCOUNTER — Encounter (HOSPITAL_COMMUNITY): Payer: Self-pay

## 2022-06-29 ENCOUNTER — Inpatient Hospital Stay
Admission: RE | Admit: 2022-06-29 | Discharge: 2022-06-29 | Disposition: A | Discharge status: Home / Self Care | Payer: Medicare HMO | Source: Ambulatory Visit | Attending: PHYSICIAN/UNDEFINED PHYSICIAN TYPE | Admitting: PHYSICIAN/UNDEFINED PHYSICIAN TYPE

## 2022-06-29 DIAGNOSIS — Z1239 Encounter for other screening for malignant neoplasm of breast: Secondary | ICD-10-CM

## 2022-06-29 DIAGNOSIS — Z1231 Encounter for screening mammogram for malignant neoplasm of breast: Secondary | ICD-10-CM | POA: Insufficient documentation

## 2022-06-30 DIAGNOSIS — Z1231 Encounter for screening mammogram for malignant neoplasm of breast: Secondary | ICD-10-CM

## 2022-07-25 ENCOUNTER — Other Ambulatory Visit (HOSPITAL_COMMUNITY): Payer: Self-pay | Admitting: PHYSICIAN/UNDEFINED PHYSICIAN TYPE

## 2022-07-25 ENCOUNTER — Other Ambulatory Visit: Payer: Self-pay

## 2022-07-25 ENCOUNTER — Other Ambulatory Visit (HOSPITAL_BASED_OUTPATIENT_CLINIC_OR_DEPARTMENT_OTHER): Payer: Medicare HMO

## 2022-07-25 ENCOUNTER — Inpatient Hospital Stay
Admission: RE | Admit: 2022-07-25 | Discharge: 2022-07-25 | Disposition: A | Discharge status: Home / Self Care | Payer: Medicare HMO | Source: Ambulatory Visit | Attending: PHYSICIAN/UNDEFINED PHYSICIAN TYPE | Admitting: PHYSICIAN/UNDEFINED PHYSICIAN TYPE

## 2022-07-25 ENCOUNTER — Inpatient Hospital Stay (HOSPITAL_BASED_OUTPATIENT_CLINIC_OR_DEPARTMENT_OTHER)
Admission: RE | Admit: 2022-07-25 | Discharge: 2022-07-25 | Disposition: A | Discharge status: Home / Self Care | Payer: Medicare HMO | Source: Ambulatory Visit | Attending: PHYSICIAN/UNDEFINED PHYSICIAN TYPE | Admitting: PHYSICIAN/UNDEFINED PHYSICIAN TYPE

## 2022-07-25 DIAGNOSIS — R1010 Upper abdominal pain, unspecified: Secondary | ICD-10-CM

## 2022-07-25 DIAGNOSIS — R1110 Vomiting, unspecified: Secondary | ICD-10-CM | POA: Insufficient documentation

## 2022-07-25 DIAGNOSIS — R079 Chest pain, unspecified: Secondary | ICD-10-CM

## 2022-07-25 DIAGNOSIS — R112 Nausea with vomiting, unspecified: Secondary | ICD-10-CM

## 2022-07-25 LAB — CBC WITH DIFF
BASOPHIL #: 0 10*3/uL (ref 0.00–0.10)
BASOPHIL %: 0 % (ref 0–1)
EOSINOPHIL #: 0 10*3/uL (ref 0.00–0.50)
EOSINOPHIL %: 0 % — ABNORMAL LOW
HCT: 41.4 % (ref 31.2–41.9)
HGB: 13.9 g/dL (ref 10.9–14.3)
LYMPHOCYTE #: 1 10*3/uL (ref 1.00–3.00)
LYMPHOCYTE %: 6 % — ABNORMAL LOW (ref 16–44)
MCH: 29.5 pg (ref 24.7–32.8)
MCHC: 33.5 g/dL (ref 32.3–35.6)
MCV: 88 fL (ref 75.5–95.3)
MONOCYTE #: 1.2 10*3/uL — ABNORMAL HIGH (ref 0.30–1.00)
MONOCYTE %: 7 % (ref 5–13)
MPV: 8.6 fL (ref 7.9–10.8)
NEUTROPHIL #: 14.2 10*3/uL — ABNORMAL HIGH (ref 1.85–7.80)
NEUTROPHIL %: 87 % — ABNORMAL HIGH (ref 43–77)
PLATELETS: 305 10*3/uL (ref 140–440)
RBC: 4.71 10*6/uL (ref 3.63–4.92)
RDW: 13.1 % (ref 12.3–17.7)
WBC: 16.4 10*3/uL — ABNORMAL HIGH (ref 3.8–11.8)

## 2022-07-25 LAB — COMPREHENSIVE METABOLIC PANEL, NON-FASTING
ALBUMIN/GLOBULIN RATIO: 1.7 — ABNORMAL HIGH (ref 0.8–1.4)
ALBUMIN: 4.5 g/dL (ref 3.5–5.7)
ALKALINE PHOSPHATASE: 91 U/L (ref 34–104)
ALT (SGPT): 16 U/L (ref 7–52)
ANION GAP: 9 mmol/L (ref 4–13)
AST (SGOT): 15 U/L (ref 13–39)
BILIRUBIN TOTAL: 0.7 mg/dL (ref 0.3–1.2)
BUN/CREA RATIO: 17 (ref 6–22)
BUN: 18 mg/dL (ref 7–25)
CALCIUM, CORRECTED: 9.5 mg/dL (ref 8.9–10.8)
CALCIUM: 10 mg/dL (ref 8.6–10.3)
CHLORIDE: 109 mmol/L — ABNORMAL HIGH (ref 98–107)
CO2 TOTAL: 21 mmol/L (ref 21–31)
CREATININE: 1.09 mg/dL (ref 0.60–1.30)
ESTIMATED GFR: 53 mL/min/{1.73_m2} — ABNORMAL LOW (ref >59)
GLOBULIN: 2.7 — ABNORMAL LOW (ref 2.9–5.4)
GLUCOSE: 154 mg/dL — ABNORMAL HIGH (ref 74–109)
OSMOLALITY, CALCULATED: 283 mOsm/kg (ref 270–290)
POTASSIUM: 3.8 mmol/L (ref 3.5–5.1)
PROTEIN TOTAL: 7.2 g/dL (ref 6.4–8.9)
SODIUM: 139 mmol/L (ref 136–145)

## 2022-07-25 LAB — URINALYSIS, MACROSCOPIC
BILIRUBIN: NEGATIVE mg/dL
BLOOD: NEGATIVE mg/dL
GLUCOSE: NEGATIVE mg/dL
KETONES: NEGATIVE mg/dL
LEUKOCYTES: 500 WBCs/uL — AB
NITRITE: NEGATIVE
PH: 6 (ref 5.0–9.0)
PROTEIN: 30 mg/dL — AB
SPECIFIC GRAVITY: 1.023 (ref 1.002–1.030)
UROBILINOGEN: NORMAL mg/dL

## 2022-07-25 LAB — ECG 12 LEAD
Atrial Rate: 93 {beats}/min
Calculated P Axis: 75 degrees
Calculated R Axis: 50 degrees
Calculated T Axis: 70 degrees
PR Interval: 142 ms
QRS Duration: 80 ms
QT Interval: 352 ms
QTC Calculation: 437 ms
Ventricular rate: 93 {beats}/min

## 2022-07-25 LAB — URINALYSIS, MICROSCOPIC
BACTERIA: NEGATIVE /hpf
RBCS: 6 /hpf — ABNORMAL HIGH (ref <4)
SQUAMOUS EPITHELIAL: 1 /hpf (ref <28)
WBCS: 133 /hpf — ABNORMAL HIGH (ref <6)

## 2022-07-25 LAB — LIPASE: LIPASE: 16 U/L (ref 11–82)

## 2022-07-25 LAB — AMYLASE: AMYLASE: 44 U/L (ref 29–103)

## 2022-07-27 ENCOUNTER — Other Ambulatory Visit: Payer: Self-pay

## 2022-07-27 ENCOUNTER — Inpatient Hospital Stay
Admission: RE | Admit: 2022-07-27 | Discharge: 2022-07-27 | Disposition: A | Discharge status: Home / Self Care | Payer: Medicare HMO | Source: Ambulatory Visit | Attending: PHYSICIAN/UNDEFINED PHYSICIAN TYPE | Admitting: PHYSICIAN/UNDEFINED PHYSICIAN TYPE

## 2022-07-27 ENCOUNTER — Other Ambulatory Visit (HOSPITAL_BASED_OUTPATIENT_CLINIC_OR_DEPARTMENT_OTHER): Payer: Medicare HMO

## 2022-07-27 DIAGNOSIS — N390 Urinary tract infection, site not specified: Secondary | ICD-10-CM | POA: Insufficient documentation

## 2022-07-27 DIAGNOSIS — R112 Nausea with vomiting, unspecified: Secondary | ICD-10-CM

## 2022-07-27 DIAGNOSIS — R739 Hyperglycemia, unspecified: Secondary | ICD-10-CM | POA: Insufficient documentation

## 2022-07-27 LAB — HGA1C (HEMOGLOBIN A1C WITH EST AVG GLUCOSE): HEMOGLOBIN A1C: 5.6 % (ref 4.0–6.0)

## 2022-07-27 MED ORDER — BARIUM SULFATE 105 % (W/V), 58 % (W/W) ORAL SUSPENSION
300.0000 mL | ORAL | Status: AC
Start: 2022-07-27 — End: 2022-07-27
  Administered 2022-07-27: 300 mL via RECTAL

## 2022-07-27 MED ORDER — BARIUM SULFATE 96 % (W/W) ORAL POWDER FOR SUSPENSION
176.0000 g | INHALATION_SUSPENSION | ORAL | Status: AC
Start: 2022-07-27 — End: 2022-07-27
  Administered 2022-07-27: 176 g via ORAL

## 2022-07-27 MED ORDER — BARIUM SULFATE 700 MG TABLET
1.0000 | ORAL_TABLET | ORAL | Status: AC
Start: 2022-07-27 — End: 2022-07-27
  Administered 2022-07-27: 1 via ORAL

## 2022-07-27 MED ORDER — SOD BICARB-CITRIC AC-SIMETH 2.21 GRAM-1.53 GRAM/4 GRAM GRANULES EFFERV
1.0000 | GRANULES | ORAL | Status: AC
Start: 2022-07-27 — End: 2022-07-27
  Administered 2022-07-27: 1 via ORAL

## 2022-07-28 ENCOUNTER — Emergency Department (HOSPITAL_COMMUNITY): Payer: Medicare HMO

## 2022-07-28 ENCOUNTER — Other Ambulatory Visit: Payer: Self-pay

## 2022-07-28 ENCOUNTER — Encounter (HOSPITAL_COMMUNITY): Payer: Self-pay

## 2022-07-28 ENCOUNTER — Emergency Department
Admission: EM | Admit: 2022-07-28 | Discharge: 2022-07-29 | Disposition: A | Discharge status: Other Acute Inpatient Hospital | Payer: Medicare HMO | Attending: Emergency Medicine | Admitting: Emergency Medicine

## 2022-07-28 DIAGNOSIS — N132 Hydronephrosis with renal and ureteral calculous obstruction: Secondary | ICD-10-CM | POA: Insufficient documentation

## 2022-07-28 DIAGNOSIS — N12 Tubulo-interstitial nephritis, not specified as acute or chronic: Secondary | ICD-10-CM | POA: Insufficient documentation

## 2022-07-28 DIAGNOSIS — A419 Sepsis, unspecified organism: Secondary | ICD-10-CM | POA: Insufficient documentation

## 2022-07-28 DIAGNOSIS — Z87442 Personal history of urinary calculi: Secondary | ICD-10-CM | POA: Insufficient documentation

## 2022-07-28 DIAGNOSIS — Z1152 Encounter for screening for COVID-19: Secondary | ICD-10-CM | POA: Insufficient documentation

## 2022-07-28 DIAGNOSIS — R918 Other nonspecific abnormal finding of lung field: Secondary | ICD-10-CM | POA: Insufficient documentation

## 2022-07-28 DIAGNOSIS — N136 Pyonephrosis: Secondary | ICD-10-CM

## 2022-07-28 DIAGNOSIS — N200 Calculus of kidney: Secondary | ICD-10-CM

## 2022-07-28 LAB — MANUAL DIFFERENTIAL
BASOPHIL %: 1 % (ref 0–3)
BASOPHIL ABSOLUTE: 0.11 10*3/uL (ref 0.00–0.30)
BASOPHILS MANUAL: 1
LYMPHOCYTE %: 10 % — ABNORMAL LOW (ref 25–45)
LYMPHOCYTE ABSOLUTE: 1.12 10*3/uL (ref 1.10–5.00)
LYMPHOCYTES MANUAL: 10
MONOCYTE %: 3 % (ref 0–12)
MONOCYTE ABSOLUTE: 0.34 10*3/uL (ref 0.00–1.30)
MONOCYTES MANUAL: 3
NEUTROPHIL %: 86 % — ABNORMAL HIGH (ref 40–76)
NEUTROPHIL ABSOLUTE: 9.63 10*3/uL — ABNORMAL HIGH (ref 1.80–8.40)
NEUTROPHILS MANUAL: 86
PLATELET MORPHOLOGY COMMENT: NORMAL
RBC MORPHOLOGY COMMENT: NORMAL
TOTAL CELLS COUNTED [#] IN BLOOD: 100
WBC: 11.2 10*3/uL

## 2022-07-28 LAB — URINALYSIS, MACROSCOPIC
BILIRUBIN: NEGATIVE mg/dL
BLOOD: 0.1 mg/dL — AB
GLUCOSE: NEGATIVE mg/dL
KETONES: 20 mg/dL — AB
LEUKOCYTES: 500 WBCs/uL — AB
PH: 8 (ref 5.0–9.0)
PROTEIN: 300 mg/dL — AB
SPECIFIC GRAVITY: 1.016 (ref 1.002–1.030)
UROBILINOGEN: NORMAL mg/dL

## 2022-07-28 LAB — COVID-19, FLU A/B, RSV RAPID BY PCR
INFLUENZA VIRUS TYPE A: NOT DETECTED
INFLUENZA VIRUS TYPE B: NOT DETECTED
RESPIRATORY SYNCTIAL VIRUS (RSV): NOT DETECTED
SARS-CoV-2: NOT DETECTED

## 2022-07-28 LAB — URINALYSIS, MICROSCOPIC
RBCS: 4 /hpf — ABNORMAL HIGH (ref <4)
SQUAMOUS EPITHELIAL: 1 /hpf (ref <28)
WBCS: 379 /hpf — ABNORMAL HIGH (ref <6)

## 2022-07-28 LAB — COMPREHENSIVE METABOLIC PANEL, NON-FASTING
ALBUMIN/GLOBULIN RATIO: 1.2 (ref 0.8–1.4)
ALBUMIN: 4.2 g/dL (ref 3.5–5.7)
ALKALINE PHOSPHATASE: 110 U/L — ABNORMAL HIGH (ref 34–104)
ALT (SGPT): 28 U/L (ref 7–52)
ANION GAP: 15 mmol/L — ABNORMAL HIGH (ref 4–13)
AST (SGOT): 14 U/L (ref 13–39)
BILIRUBIN TOTAL: 0.8 mg/dL (ref 0.3–1.2)
BUN/CREA RATIO: 20 (ref 6–22)
BUN: 30 mg/dL — ABNORMAL HIGH (ref 7–25)
CALCIUM, CORRECTED: 10 mg/dL (ref 8.9–10.8)
CALCIUM: 10.2 mg/dL (ref 8.6–10.3)
CHLORIDE: 104 mmol/L (ref 98–107)
CO2 TOTAL: 18 mmol/L — ABNORMAL LOW (ref 21–31)
CREATININE: 1.47 mg/dL — ABNORMAL HIGH (ref 0.60–1.30)
ESTIMATED GFR: 37 mL/min/{1.73_m2} — ABNORMAL LOW (ref >59)
GLOBULIN: 3.5 (ref 2.9–5.4)
GLUCOSE: 220 mg/dL — ABNORMAL HIGH (ref 74–109)
OSMOLALITY, CALCULATED: 287 mOsm/kg (ref 270–290)
POTASSIUM: 4.3 mmol/L (ref 3.5–5.1)
PROTEIN TOTAL: 7.7 g/dL (ref 6.4–8.9)
SODIUM: 137 mmol/L (ref 136–145)

## 2022-07-28 LAB — CBC WITH DIFF
HCT: 38.5 % (ref 31.2–41.9)
HGB: 13.1 g/dL (ref 10.9–14.3)
MCH: 30.4 pg (ref 24.7–32.8)
MCHC: 34.1 g/dL (ref 32.3–35.6)
MCV: 89 fL (ref 75.5–95.3)
MPV: 8.6 fL (ref 7.9–10.8)
PLATELETS: 352 10*3/uL (ref 140–440)
RBC: 4.33 10*6/uL (ref 3.63–4.92)
RDW: 12.6 % (ref 12.3–17.7)
WBC: 11.2 10*3/uL (ref 3.8–11.8)

## 2022-07-28 LAB — LACTIC ACID LEVEL W/ REFLEX FOR LEVEL >2.0: LACTIC ACID: 2 mmol/L (ref 0.5–2.2)

## 2022-07-28 MED ORDER — IBUPROFEN 400 MG TABLET
400.0000 mg | ORAL_TABLET | ORAL | Status: AC
Start: 2022-07-28 — End: 2022-07-28
  Administered 2022-07-28: 400 mg via ORAL

## 2022-07-28 MED ORDER — SODIUM CHLORIDE 0.9 % IV BOLUS
1000.0000 mL | INJECTION | Status: AC
Start: 2022-07-28 — End: 2022-07-28
  Administered 2022-07-28: 0 mL via INTRAVENOUS
  Administered 2022-07-28: 1000 mL via INTRAVENOUS

## 2022-07-28 MED ORDER — ONDANSETRON HCL (PF) 4 MG/2 ML INJECTION SOLUTION
INTRAMUSCULAR | Status: AC
Start: 2022-07-28 — End: 2022-07-28
  Filled 2022-07-28: qty 2

## 2022-07-28 MED ORDER — ACETAMINOPHEN 325 MG TABLET
975.0000 mg | ORAL_TABLET | ORAL | Status: AC
Start: 2022-07-28 — End: 2022-07-28
  Administered 2022-07-28: 975 mg via ORAL

## 2022-07-28 MED ORDER — MORPHINE 2 MG/ML INJECTION WRAPPER
INJECTION | INTRAMUSCULAR | Status: AC
Start: 2022-07-28 — End: 2022-07-28
  Filled 2022-07-28: qty 1

## 2022-07-28 MED ORDER — FLUCONAZOLE 100 MG TABLET
150.0000 mg | ORAL_TABLET | ORAL | Status: AC
Start: 2022-07-28 — End: 2022-07-28
  Administered 2022-07-28: 150 mg via ORAL

## 2022-07-28 MED ORDER — MORPHINE 2 MG/ML INJECTION WRAPPER
2.0000 mg | INJECTION | INTRAMUSCULAR | Status: AC
Start: 2022-07-28 — End: 2022-07-28
  Administered 2022-07-28: 2 mg via INTRAVENOUS

## 2022-07-28 MED ORDER — FLUCONAZOLE 100 MG TABLET
ORAL_TABLET | ORAL | Status: AC
Start: 2022-07-28 — End: 2022-07-28
  Filled 2022-07-28: qty 2

## 2022-07-28 MED ORDER — ACETAMINOPHEN 325 MG TABLET
ORAL_TABLET | ORAL | Status: AC
Start: 2022-07-28 — End: 2022-07-28
  Filled 2022-07-28: qty 3

## 2022-07-28 MED ORDER — IBUPROFEN 400 MG TABLET
ORAL_TABLET | ORAL | Status: AC
Start: 2022-07-28 — End: 2022-07-28
  Filled 2022-07-28: qty 1

## 2022-07-28 MED ORDER — SODIUM CHLORIDE 0.9 % INTRAVENOUS PIGGYBACK
1.0000 g | INTRAVENOUS | Status: AC
Start: 2022-07-28 — End: 2022-07-28
  Administered 2022-07-28: 0 g via INTRAVENOUS
  Administered 2022-07-28: 1 g via INTRAVENOUS

## 2022-07-28 MED ORDER — SODIUM CHLORIDE 0.9 % INTRAVENOUS SOLUTION
1.0000 ug/min | INTRAVENOUS | Status: DC
Start: 2022-07-29 — End: 2022-07-29
  Administered 2022-07-28: 1 ug/min via INTRAVENOUS
  Administered 2022-07-28: 6 ug/min via INTRAVENOUS
  Administered 2022-07-28: 12 ug/min via INTRAVENOUS
  Administered 2022-07-28: 10 ug/min via INTRAVENOUS
  Administered 2022-07-28: 2 ug/min via INTRAVENOUS
  Administered 2022-07-28: 18 ug/min via INTRAVENOUS
  Administered 2022-07-28: 7 ug/min via INTRAVENOUS
  Administered 2022-07-28: 5 ug/min via INTRAVENOUS
  Administered 2022-07-28: 8 ug/min via INTRAVENOUS
  Administered 2022-07-28: 16 ug/min via INTRAVENOUS
  Administered 2022-07-28: 14 ug/min via INTRAVENOUS
  Administered 2022-07-28: 3 ug/min via INTRAVENOUS
  Filled 2022-07-28: qty 8

## 2022-07-28 MED ORDER — ONDANSETRON HCL (PF) 4 MG/2 ML INJECTION SOLUTION
4.0000 mg | INTRAMUSCULAR | Status: AC
Start: 2022-07-28 — End: 2022-07-28
  Administered 2022-07-28: 4 mg via INTRAVENOUS

## 2022-07-28 MED ORDER — CEFTRIAXONE 1 GRAM SOLUTION FOR INJECTION
INTRAMUSCULAR | Status: AC
Start: 2022-07-28 — End: 2022-07-28
  Filled 2022-07-28: qty 10

## 2022-07-28 MED ORDER — SODIUM CHLORIDE 0.9 % IV BOLUS
1000.0000 mL | INJECTION | Status: AC
Start: 2022-07-28 — End: 2022-07-28
  Administered 2022-07-28: 1000 mL via INTRAVENOUS
  Administered 2022-07-28: 0 mL via INTRAVENOUS

## 2022-07-28 MED ORDER — SODIUM CHLORIDE 0.9 % INTRAVENOUS PIGGYBACK
INJECTION | INTRAVENOUS | Status: AC
Start: 2022-07-28 — End: 2022-07-28
  Filled 2022-07-28: qty 50

## 2022-07-28 NOTE — ED Triage Notes (Signed)
Diagnosed with a UTI/kidney infection by Dr. Garlon Hatchet but has not gotten a prescription d/t him being out of the office yesterday; continued R sided flank pain, nausea, and vomiting

## 2022-07-28 NOTE — ED Attending Handoff Note (Signed)
[facility]  Emergency Department  Provider in Triage Note    Name: Soriah Leeman  Age: 76 y.o.  Gender: female     Subjective:   Ondine Gemme is a 76 y.o. female who presents with complaint of Urinary Pain and Flank Pain  .  Patient reports being diagnosed with a "kidney infection,"however not being treated.    Objective:   Filed Vitals:    07/28/22 1436   BP: (!) 165/95   Pulse: 78   Resp: 20   Temp: 36.9 C (98.4 F)   SpO2: 100%      Vitals are also documented in the EMR.  Focused Physical Exam shows 75 year old female with right flank pain    Assessment:  A medical screening exam was completed.  This patient is a 76 y.o. female with right flank.    Plan:  Please see initial orders and work-up in the EMR.  This is to be continued with full evaluation in the main Emergency Department.     acetaminophen (TYLENOL) tablet, 975 mg, Oral, Now  cefTRIAXone (ROCEPHIN) 1 g in NS 50 mL IVPB minibag, 1 g, Intravenous, Now  fluconazole (DIFLUCAN) tablet, 150 mg, Oral, Now  NS bolus infusion 1,000 mL, 1,000 mL, Intravenous, Now       Results for orders placed or performed during the hospital encounter of 07/28/22 (from the past 24 hour(s))   CBC/DIFF    Narrative    The following orders were created for panel order CBC/DIFF.  Procedure                               Abnormality         Status                     ---------                               -----------         ------                     CBC WITH JJOA[[id number]]                                                                 Please view results for these tests on the individual orders.   URINALYSIS, MACROSCOPIC AND MICROSCOPIC W/CULTURE REFLEX    Specimen: Urine, Clean Catch    Narrative    The following orders were created for panel order URINALYSIS, MACROSCOPIC AND MICROSCOPIC W/CULTURE REFLEX.  Procedure                               Abnormality         Status                     ---------                                -----------         ------  URINALYSIS, MACROSCOPIC[[id number]]                                                     URINALYSIS, MICROSCOPIC[[id number]]                                                       Please view results for these tests on the individual orders.        Vonda Antigua, MD  07/28/2022, 14:43

## 2022-07-28 NOTE — ED Nurses Note (Signed)
Lab at bedside to draw second set of ordered [facility]

## 2022-07-28 NOTE — ED Nurses Note (Signed)
Patient encouraged to provide urine sample at this time. Patient reports "I just did that yesterday". Patient informed ordered urine is required for dx and treatment.

## 2022-07-28 NOTE — ED Nurses Note (Signed)
Report given to Loma Sousa, RN at [facility].

## 2022-07-28 NOTE — ED Provider Notes (Signed)
Emergency Medicine    Name: Leslie Hall  Age and Gender: 76 y.o. female  Date of Birth: [birth year]  MRN: [id number]  PCP: Octaviano Batty, MD    CC:  Chief Complaint   Patient presents with    Urinary Pain    Flank Pain       HPI:  Leslie Hall is a 76 y.o. White female with history of one week of right flank pain and vomiting, consistent with UTI. She has been seeing her PCP all week, Dr. Lind Guest. She denies fever, chest pain, cough or dyspnea.    She has a remote history of kidney stone. She states that she cannot have a CT scan and won't because it causes vertigo.      [place] Pain Rating Scale     On a scale of 0-10, during the past 24 hours, pain has interfered with you usual activity:       On a scale of 0-10, during the past 24 hours, pain has interfered with your sleep:      On a scale of 0-10, during the past 24 hours, pain has affected your mood:       On a scale of 0-10, during the past 24 hours, pain has contributed to your stress:       On a scale of 0-10, what is your overall pain Rating: 2        Below pertinent information reviewed with patient:  Past Medical History:   Diagnosis Date    Hypertension     Mixed hyperlipidemia            Allergies   Allergen Reactions    Latex     Mobic [Meloxicam]     Niacin     Penicillins        Past Surgical History:   Procedure Laterality Date    HEEL SPUR SURGERY      HX BREAST BIOPSY Bilateral     yrs ago -    HX HYSTERECTOMY      HX TRIGGER FINGER RELEASE             Social History        Objective:    ED Triage Vitals [07/28/22 1436]   BP (Non-Invasive) (!) 165/95   Heart Rate 78   Respiratory Rate 20   Temperature 36.9 C (98.4 F)   SpO2 100 %   Weight 65.3 kg (144 lb)   Height 1.702 m ('5\' 7"'$ )     Filed Vitals:    07/28/22 1945 07/28/22 2000 07/28/22 2029 07/28/22 2030   BP: (!) 92/57 (!) 77/54  (!) 76/55   Pulse: (!) 110 100  (!) 115   Resp: (!) 31 (!) 23  (!) 25   Temp:   37.8 C (100.1 F)    SpO2: 93% 94%  92%       Nursing notes and vital signs  reviewed.    Constitutional - No acute distress.  Alert and Active.  HEENT - Normocephalic. Atraumatic. PERRL. EOMI. Conjunctiva clear. Oropharynx with no erythema, lesions, or exudates. Moist mucous membranes.   Neck - Trachea midline. No stridor. No hoarseness.  Cardiac - Tachycardia, regular.  No murmurs, rubs, or gallops.  Respiratory - Clear to auscultation bilaterally. No rales, wheezes or rhonchi.  Abdomen - Non-tender, soft, non-distended. No rebound or guarding.   GU: right flank tender  Musculoskeletal - Good AROM. No muscle or joint tenderness appreciated. No clubbing, cyanosis or edema.  Skin - Warm and dry, without any rashes or other lesions.  Neuro - Cranial nerves II-XII are grossly intact.  Moving all extremities symmetrically.    Any pertinent labs and imaging obtained during this encounter reviewed below in MDM.    MDM/ED Course:    Patient has findings consistent with pyelonephritis; her fever spiked to 101 and she has UTI.  She refused CT abdomen/pelvis repeatedly stating that something bad, like vertigo, always happens when she has a CT scan.    Renal  US showed hydronephrosis.   She agreed to KUB which showed bilateral renal calculi.      She finally agreed to CT and was found to have right hydronephrosis, hydroureter with distal stone.  She has left renal pelvis stone and bilateral renal calculi.    CT also shows bibasilar infiltrates, although she has no respiratory symptoms.    She has received NS boluses x 3 liters but has a normal lactic acid.  She nevertheless has a presentation consistent with sepsis.     She has received po fluconazole, IV morphine and Zofran, po tylenol and motrin.    We do not have urology coverage this weekend and she agrees to transfer. She is accepted ED to ED to [facility], Dr. Nils Pyle, ED physician, accepting.    Medical Decision Making  Amount and/or Complexity of Data Reviewed  Labs: ordered. Decision-making details documented in ED  Course.  Radiology: ordered. Decision-making details documented in ED Course.    Risk  Prescription drug management.  Decision regarding hospitalization.             Orders Placed This Encounter    ADULT ROUTINE BLOOD CULTURE, SET OF 2 ADULT BOTTLES (BACTERIA AND YEAST)    ADULT ROUTINE BLOOD CULTURE, SET OF 2 ADULT BOTTLES (BACTERIA AND YEAST)    URINE CULTURE,ROUTINE    CANCELED: CT ABDOMEN PELVIS WO IV CONTRAST    US KIDNEY                      XR KUB AND UPRIGHT ABDOMEN    CT ABDOMEN PELVIS WO IV CONTRAST    CBC/DIFF    COMPREHENSIVE METABOLIC PANEL, NON-FASTING    URINALYSIS, MACROSCOPIC AND MICROSCOPIC W/CULTURE REFLEX    CBC WITH DIFF    URINALYSIS, MACROSCOPIC    URINALYSIS, MICROSCOPIC    MANUAL DIFFERENTIAL    LACTIC ACID LEVEL W/ REFLEX FOR LEVEL >2.0    CANCELED: COVID-19, FLU A/B, RSV RAPID BY PCR    COVID-19, FLU A/B, RSV RAPID BY PCR    acetaminophen (TYLENOL) tablet    cefTRIAXone (ROCEPHIN) 1 g in NS 50 mL IVPB minibag    fluconazole (DIFLUCAN) tablet    NS bolus infusion 1,000 mL    morphine 2 mg/mL injection    ondansetron (ZOFRAN) 2 mg/mL injection    ibuprofen (MOTRIN) tablet    NS bolus infusion 1,000 mL    NS bolus infusion 1,000 mL       Impression:   Clinical Impression   Pyelonephritis (Primary)   Ureteral stone with hydronephrosis   Sepsis, due to unspecified organism, unspecified whether acute organ dysfunction present (CMS [facility])       Disposition: Transfered to Another Facility          Portions of this note may have been dictated using voice recognition software.     Cruzita Lederer, MD  [facility] ED    -----------------------  Results for orders placed  or performed during the hospital encounter of 07/28/22 (from the past 12 hour(s))   COMPREHENSIVE METABOLIC PANEL, NON-FASTING   Result Value Ref Range    SODIUM 137 136 - 145 mmol/L    POTASSIUM 4.3 3.5 - 5.1 mmol/L    CHLORIDE 104 98 - 107 mmol/L    CO2 TOTAL 18 (L) 21 - 31 mmol/L    ANION GAP 15 (H) 4 - 13 mmol/L    BUN  30 (H) 7 - 25 mg/dL    CREATININE 1.47 (H) 0.60 - 1.30 mg/dL    BUN/CREA RATIO 20 6 - 22    ESTIMATED GFR 37 (L) >59 mL/min/1.1m2    ALBUMIN 4.2 3.5 - 5.7 g/dL    CALCIUM 10.2 8.6 - 10.3 mg/dL    GLUCOSE 220 (H) 74 - 109 mg/dL    ALKALINE PHOSPHATASE 110 (H) 34 - 104 U/L    ALT (SGPT) 28 7 - 52 U/L    AST (SGOT) 14 13 - 39 U/L    BILIRUBIN TOTAL 0.8 0.3 - 1.2 mg/dL    PROTEIN TOTAL 7.7 6.4 - 8.9 g/dL    ALBUMIN/GLOBULIN RATIO 1.2 0.8 - 1.4    OSMOLALITY, CALCULATED 287 270 - 290 mOsm/kg    CALCIUM, CORRECTED 10.0 8.9 - 10.8 mg/dL    GLOBULIN 3.5 2.9 - 5.4   CBC WITH DIFF   Result Value Ref Range    WBC 11.2 3.8 - 11.8 x10^3/uL    RBC 4.33 3.63 - 4.92 x10^6/uL    HGB 13.1 10.9 - 14.3 g/dL    HCT 38.5 31.2 - 41.9 %    MCV 89.0 75.5 - 95.3 fL    MCH 30.4 24.7 - 32.8 pg    MCHC 34.1 32.3 - 35.6 g/dL    RDW 12.6 12.3 - 17.7 %    PLATELETS 352 140 - 440 x10^3/uL    MPV 8.6 7.9 - 10.8 fL   MANUAL DIFFERENTIAL   Result Value Ref Range    WBC 11.2 x10^3/uL    NEUTROPHIL % 86 (H) 40 - 76 %    LYMPHOCYTE % 10 (L) 25 - 45 %    MONOCYTE % 3 0 - 12 %    EOSINOPHIL %      BASOPHIL % 1 0 - 3 %    METAMYELOCYTE %      MYELOCYTE %      PROMYELOCYTE %      BAND %      BLAST %      OTHER %      NEUTROPHIL ABSOLUTE 9.63 (H) 1.80 - 8.40 x10^3/uL    LYMPHOCYTE ABSOLUTE 1.12 1.10 - 5.00 x10^3/uL    MONOCYTE ABSOLUTE 0.34 0.00 - 1.30 x10^3/uL    EOSINOPHIL ABSOLUTE      BASOPHIL ABSOLUTE 0.11 0.00 - 0.30 x10^3/uL    METAMYELOCYTE ABSOLUTE      MYELOCYTE ABSOLUTE      PROMYELOCYTE ABSOLUTE      BLAST ABSOLUTE      OTHER CELL ABSOLUTE      ANISOCYTOSIS      POLYCHROMASIA      POIKILOCYTOSIS      BASOPHILIC STIPPLING      MICROCYTOSIS      MACROCYTOSIS      ROULEAUX      SCHISTOCYTES      SPHEROCYTES      TARGET CELLS      TEARDROP CELLS      OVALOCYTE (ELLIPTOCYTE)  CRENATED RED CELLS      STOMATOCYTES      ACANTHOCYTES (SPUR CELL)      ECHINOCYTE (BURR CELL)      BLISTER CELLS      RBC AGGLUTINATES      HOWELL JOLLY BODIES       ATYPICAL LYMPHOCYTES      TOXIC GRANULATION      DOHLE BODIES      TOXIC VACUOLIZATION      AUER RODS      BASKET CELLS      HYPERSEGMENTATION      LARGE PLATELETS      PLATELET CLUMPS      WBC MORPHOLOGY COMMENT      RBC MORPHOLOGY COMMENT Normal     PLATELET MORPHOLOGY COMMENT Normal     BANDS NEUTROPHILS MANUAL      BAND ABSOLUTE      NEUTROPHILS MANUAL 86     LYMPHOCYTES MANUAL 10     MONOCYTES MANUAL 3     EOSINOPHILS MANUAL      BASOPHILS MANUAL 1     PROMYELOCYTES MANUAL      MYELOCYTES MANUAL      METAMYELOCYTES MANUAL      BLASTS MANUAL      TOTAL CELLS COUNTED [#] IN BLOOD 100     OTHER CELLS MANUAL      NUCLEATED RBC MANUAL      PLASMA CELL %      PLASMA CELL ABSOLUE      PLASMA CELLS MANUAL      HYPOCHROMASIA     URINALYSIS, MACROSCOPIC   Result Value Ref Range    COLOR Yellow Colorless, Light Yellow, Yellow    APPEARANCE Ex. Turbid (A) Clear    SPECIFIC GRAVITY 1.016 1.002 - 1.030    PH 8.0 5.0 - 9.0    LEUKOCYTES 500 (A) Negative, 100  WBCs/uL    NITRITE 2+ (A) Negative    PROTEIN 300 (A) Negative, 10 , 20  mg/dL    GLUCOSE Negative Negative, 30  mg/dL    KETONES 20 (A) Negative, Trace mg/dL    BILIRUBIN Negative Negative, 0.5 mg/dL    BLOOD 0.1 (A) Negative, 0.03 mg/dL    UROBILINOGEN Normal Normal mg/dL   URINALYSIS, MICROSCOPIC   Result Value Ref Range    BACTERIA Moderate (A) Negative /hpf    MUCOUS Few (A) (none) /hpf    RBCS 4 (H) <4 /hpf    WBCS 379 (H) <6 /hpf    WHITE BLOOD CELL CLUMP Many (A) (none) /hpf    SQUAMOUS EPITHELIAL 1 <28 /hpf   LACTIC ACID LEVEL W/ REFLEX FOR LEVEL >2.0   Result Value Ref Range    LACTIC ACID 2.0 0.5 - 2.2 mmol/L     CT ABDOMEN PELVIS WO IV CONTRAST   Final Result   1. Right obstructive uropathy due to a distal ureteral calculus with significant proximal dilatation   2. Bilateral renal calculi including a calculus in the left renal pelvis   3. Bilateral basilar pulmonary infiltrates          One or more dose reduction techniques were used (e.g., Automated exposure  control, adjustment of the mA and/or kV according to patient size, use of iterative reconstruction technique).         Radiologist location ID: [id number]         XR KUB AND UPRIGHT ABDOMEN   Final Result   Small bilateral renal calculi are suggested.  Radiologist location ID: [id number]         US KIDNEY                     Final Result   1. RIGHT HYDRONEPHROSIS.   2. MILD LEFT RENAL DILATATION               Radiologist location ID: [id number]

## 2022-07-28 NOTE — ED Nurses Note (Signed)
Patient febrile at this time. MAP 65. Dr. Rosalee Kaufman notified.

## 2022-07-29 LAB — BLOOD CULTURE ID GRAM NEGATIVE
ACINETOBACTER SPECIES: NOT DETECTED
CITROBACTER SPECIES: NOT DETECTED
CTX-M CLASS A ESBL: NOT DETECTED
ENTEROBACTER SPECIES: NOT DETECTED
ESCHERICHIA COLI: NOT DETECTED
IMPENEM RESIST BETA-LACT: NOT DETECTED
KLEBSIELLA OXYTOCA: NOT DETECTED
KLEBSIELLA PNEUMONIAE CRE: NOT DETECTED
KLEBSIELLA PNEUMONIAE: NOT DETECTED
NEW DELHI MET-BETA-LACT: NOT DETECTED
OXACILLINASE BETA-LACT: NOT DETECTED
PROTEUS SPECIES: DETECTED — AB
PSEUDOMONAS AERUGINOSA: NOT DETECTED
VERONA MET-BETA-LACT: NOT DETECTED

## 2022-07-29 LAB — URINE CULTURE: URINE CULTURE: >100000 — AB

## 2022-07-29 LAB — ADULT ROUTINE BLOOD CULTURE, SET OF 2 BOTTLES (BACTERIA AND YEAST): BLOOD CULTURE, ROUTINE: ABNORMAL — CR

## 2022-07-29 NOTE — ED Nurses Note (Signed)
Patient left ED via ambulance for transport to [facility]. Levophed infusing. I.V. access x 2 and triple lumen access in place. All belongings, as well as consent for central line placement sent with patient. Pt left ED via EMS at this time.

## 2022-07-29 NOTE — ED Nurses Note (Signed)
[facility] here to transport.

## 2022-07-29 NOTE — Nurses Notes (Signed)
KIM G AND I CALLED [facility] W ABNORMAL RESULTS OF BLOOD CULTURES TO [facility] RN

## 2022-07-30 LAB — ADULT ROUTINE BLOOD CULTURE, SET OF 2 BOTTLES (BACTERIA AND YEAST): BLOOD CULTURE, ROUTINE: ABNORMAL — CR

## 2022-07-30 LAB — URINE CULTURE,ROUTINE: URINE CULTURE: >100000 — AB

## 2022-08-08 ENCOUNTER — Other Ambulatory Visit: Payer: Self-pay

## 2022-08-08 ENCOUNTER — Other Ambulatory Visit: Payer: Medicare HMO | Attending: PHYSICIAN/UNDEFINED PHYSICIAN TYPE

## 2022-08-08 DIAGNOSIS — A419 Sepsis, unspecified organism: Secondary | ICD-10-CM | POA: Insufficient documentation

## 2022-08-08 DIAGNOSIS — N390 Urinary tract infection, site not specified: Secondary | ICD-10-CM | POA: Insufficient documentation

## 2022-08-08 LAB — COMPREHENSIVE METABOLIC PANEL, NON-FASTING
ALBUMIN/GLOBULIN RATIO: 1.3 (ref 0.8–1.4)
ALBUMIN: 4.1 g/dL (ref 3.5–5.7)
ALKALINE PHOSPHATASE: 154 U/L — ABNORMAL HIGH (ref 34–104)
ALT (SGPT): 41 U/L (ref 7–52)
ANION GAP: 9 mmol/L (ref 4–13)
AST (SGOT): 18 U/L (ref 13–39)
BILIRUBIN TOTAL: 0.6 mg/dL (ref 0.3–1.2)
BUN/CREA RATIO: 16 (ref 6–22)
BUN: 13 mg/dL (ref 7–25)
CALCIUM, CORRECTED: 9.9 mg/dL (ref 8.9–10.8)
CALCIUM: 10 mg/dL (ref 8.6–10.3)
CHLORIDE: 111 mmol/L — ABNORMAL HIGH (ref 98–107)
CO2 TOTAL: 21 mmol/L (ref 21–31)
CREATININE: 0.83 mg/dL (ref 0.60–1.30)
ESTIMATED GFR: 74 mL/min/{1.73_m2} (ref >59)
GLOBULIN: 3.2 (ref 2.9–5.4)
GLUCOSE: 107 mg/dL (ref 74–109)
OSMOLALITY, CALCULATED: 282 mOsm/kg (ref 270–290)
POTASSIUM: 3.7 mmol/L (ref 3.5–5.1)
PROTEIN TOTAL: 7.3 g/dL (ref 6.4–8.9)
SODIUM: 141 mmol/L (ref 136–145)

## 2022-08-08 LAB — URINALYSIS, MACROSCOPIC
BILIRUBIN: NEGATIVE mg/dL
BLOOD: 0.1 mg/dL — AB
GLUCOSE: NEGATIVE mg/dL
KETONES: NEGATIVE mg/dL
LEUKOCYTES: 75 WBCs/uL — AB
PH: 6 (ref 5.0–9.0)
PROTEIN: 30 mg/dL — AB
SPECIFIC GRAVITY: 1.015 (ref 1.002–1.030)
UROBILINOGEN: NORMAL mg/dL

## 2022-08-08 LAB — CBC WITH DIFF
BASOPHIL #: 0.1 10*3/uL (ref 0.00–0.10)
BASOPHIL %: 1 % (ref 0–1)
EOSINOPHIL #: 0.1 10*3/uL (ref 0.00–0.50)
EOSINOPHIL %: 2 %
HCT: 37 % (ref 31.2–41.9)
HGB: 12.6 g/dL (ref 10.9–14.3)
LYMPHOCYTE #: 1.8 10*3/uL (ref 1.00–3.00)
LYMPHOCYTE %: 22 % (ref 16–44)
MCH: 29.9 pg (ref 24.7–32.8)
MCHC: 34 g/dL (ref 32.3–35.6)
MCV: 88 fL (ref 75.5–95.3)
MONOCYTE #: 0.5 10*3/uL (ref 0.30–1.00)
MONOCYTE %: 7 % (ref 5–13)
MPV: 8.4 fL (ref 7.9–10.8)
NEUTROPHIL #: 5.5 10*3/uL (ref 1.85–7.80)
NEUTROPHIL %: 69 % (ref 43–77)
PLATELETS: 548 10*3/uL — ABNORMAL HIGH (ref 140–440)
RBC: 4.21 10*6/uL (ref 3.63–4.92)
RDW: 13.7 % (ref 12.3–17.7)
WBC: 8 10*3/uL (ref 3.8–11.8)

## 2022-08-08 LAB — URINALYSIS, MICROSCOPIC
RBCS: 19 /hpf — ABNORMAL HIGH (ref <4)
WBCS: 10 /hpf — ABNORMAL HIGH (ref <6)

## 2022-08-14 LAB — URINE CULTURE,ROUTINE: URINE CULTURE: >100000 — AB

## 2022-09-03 ENCOUNTER — Other Ambulatory Visit: Payer: Medicare HMO | Attending: PHYSICIAN/UNDEFINED PHYSICIAN TYPE

## 2022-09-03 ENCOUNTER — Other Ambulatory Visit: Payer: Self-pay

## 2022-09-03 DIAGNOSIS — N390 Urinary tract infection, site not specified: Secondary | ICD-10-CM | POA: Insufficient documentation

## 2022-09-03 LAB — URINALYSIS, MICROSCOPIC
RBCS: 460 /hpf — ABNORMAL HIGH (ref <4)
SQUAMOUS EPITHELIAL: <1 /hpf (ref <28)
WBCS: 78 /hpf — ABNORMAL HIGH (ref <6)

## 2022-09-03 LAB — URINALYSIS, MACROSCOPIC
BILIRUBIN: NEGATIVE mg/dL
BLOOD: 1 mg/dL — AB
GLUCOSE: NEGATIVE mg/dL
KETONES: NEGATIVE mg/dL
LEUKOCYTES: 500 WBCs/uL — AB
NITRITE: NEGATIVE
PH: 6 (ref 5.0–9.0)
PROTEIN: 100 mg/dL — AB
SPECIFIC GRAVITY: 1.017 (ref 1.002–1.030)
UROBILINOGEN: NORMAL mg/dL

## 2022-09-07 ENCOUNTER — Other Ambulatory Visit: Payer: Self-pay

## 2022-09-07 ENCOUNTER — Ambulatory Visit (INDEPENDENT_AMBULATORY_CARE_PROVIDER_SITE_OTHER): Payer: Medicare HMO | Admitting: Student in an Organized Health Care Education/Training Program

## 2022-09-07 ENCOUNTER — Encounter (INDEPENDENT_AMBULATORY_CARE_PROVIDER_SITE_OTHER): Payer: Self-pay | Admitting: Student in an Organized Health Care Education/Training Program

## 2022-09-07 ENCOUNTER — Other Ambulatory Visit: Payer: Medicare HMO | Attending: Student in an Organized Health Care Education/Training Program

## 2022-09-07 ENCOUNTER — Other Ambulatory Visit (INDEPENDENT_AMBULATORY_CARE_PROVIDER_SITE_OTHER): Payer: Self-pay | Admitting: Student in an Organized Health Care Education/Training Program

## 2022-09-07 VITALS — BP 153/90 | HR 93 | Ht 67.0 in | Wt 147.0 lb

## 2022-09-07 DIAGNOSIS — Z8619 Personal history of other infectious and parasitic diseases: Secondary | ICD-10-CM

## 2022-09-07 DIAGNOSIS — Z01818 Encounter for other preprocedural examination: Secondary | ICD-10-CM

## 2022-09-07 DIAGNOSIS — N1330 Unspecified hydronephrosis: Secondary | ICD-10-CM

## 2022-09-07 DIAGNOSIS — N202 Calculus of kidney with calculus of ureter: Secondary | ICD-10-CM

## 2022-09-07 DIAGNOSIS — N200 Calculus of kidney: Secondary | ICD-10-CM

## 2022-09-07 DIAGNOSIS — N201 Calculus of ureter: Secondary | ICD-10-CM

## 2022-09-07 LAB — URINALYSIS, MACROSCOPIC
BILIRUBIN: NEGATIVE mg/dL
BLOOD: 1 mg/dL — AB
GLUCOSE: NEGATIVE mg/dL
KETONES: NEGATIVE mg/dL
LEUKOCYTES: 500 WBCs/uL — AB
NITRITE: NEGATIVE
PH: 6 (ref 5.0–9.0)
PROTEIN: 200 mg/dL — AB
SPECIFIC GRAVITY: 1.021 (ref 1.002–1.030)
UROBILINOGEN: NORMAL mg/dL

## 2022-09-07 LAB — URINE CULTURE: URINE CULTURE: >100000 — AB

## 2022-09-07 LAB — BASIC METABOLIC PANEL
ANION GAP: 8 mmol/L (ref 4–13)
BUN/CREA RATIO: 20 (ref 6–22)
BUN: 22 mg/dL (ref 7–25)
CALCIUM: 10.2 mg/dL (ref 8.6–10.3)
CHLORIDE: 110 mmol/L — ABNORMAL HIGH (ref 98–107)
CO2 TOTAL: 22 mmol/L (ref 21–31)
CREATININE: 1.09 mg/dL (ref 0.60–1.30)
ESTIMATED GFR: 53 mL/min/{1.73_m2} — ABNORMAL LOW (ref >59)
GLUCOSE: 108 mg/dL (ref 74–109)
OSMOLALITY, CALCULATED: 283 mOsm/kg (ref 270–290)
POTASSIUM: 4.4 mmol/L (ref 3.5–5.1)
SODIUM: 140 mmol/L (ref 136–145)

## 2022-09-07 LAB — URINALYSIS, MICROSCOPIC
RBCS: 387 /hpf — ABNORMAL HIGH (ref <4)
SQUAMOUS EPITHELIAL: 1 /hpf (ref <28)
WBCS: 235 /hpf — ABNORMAL HIGH (ref <6)

## 2022-09-07 LAB — CBC
HCT: 41.3 % (ref 31.2–41.9)
HGB: 14.1 g/dL (ref 10.9–14.3)
MCH: 29.9 pg (ref 24.7–32.8)
MCHC: 34.1 g/dL (ref 32.3–35.6)
MCV: 87.8 fL (ref 75.5–95.3)
MPV: 8.6 fL (ref 7.9–10.8)
PLATELETS: 320 10*3/uL (ref 140–440)
RBC: 4.7 10*6/uL (ref 3.63–4.92)
RDW: 13.7 % (ref 12.3–17.7)
WBC: 8.3 10*3/uL (ref 3.8–11.8)

## 2022-09-07 NOTE — Progress Notes (Signed)
[place]    Progress Note    Name: Leslie Hall MRN:  [id number]   Date: 09/07/2022 Age: 76 y.o.       Chief Complaint: New Patient (Pt referred for eval of nephrostomy tube removal)    Subjective:   Leslie Hall is a pleasant 76 year old female who recently experienced an episode of urosepsis and obstructive uropathy secondary to a right distal ureteral calculus requiring transfer to [facility] with a nephrostomy tube placed on July 29, 2022.  She presents today for follow-up.  This was her 1st episode of a kidney stone.  Her CT also demonstrated bilateral nonobstructing kidney stones. She denies fevers, chills, nausea, vomiting, hematuria, dysuria, flank pain, incontinence, dribbling, hesitancy, suprapubic pain, headaches, vision changes, shortness of breath, chest pain.    Objective :  BP (!) 153/90 (Site: Right, Patient Position: Sitting, Cuff Size: Adult)   Pulse 93   Ht 1.702 m ('5\' 7"'$ )   Wt 66.7 kg (147 lb)   BMI 23.02 kg/m       Gen: NAD, alert  Pulm: unlabored at rest  CV: palpable pulses  Abd: soft, Nt/ND  GU: no suprapubic tenderness, no CVAT    Data reviewed:    Current Outpatient Medications   Medication Sig    amLODIPine (NORVASC) 5 mg Oral Tablet Take 1 Tablet (5 mg total) by mouth Once a day    aspirin (ECOTRIN) 81 mg Oral Tablet, Delayed Release (E.C.) Take by mouth Once a day    coenzyme Q10 30 mg Oral Capsule Take 1 Capsule (30 mg total) by mouth Every evening with dinner    Flaxseed Oil Oil     KRILL OIL ORAL Take by mouth    montelukast (SINGULAIR) 10 mg Oral Tablet Take 1 Tablet (10 mg total) by mouth Once a day    Red Yeast Rice Extract 600 mg Oral Capsule Take by mouth    tamsulosin (FLOMAX) 0.4 mg Oral Capsule TAKE 1 CAPSULE BY MOUTH ONCE DAILY AT BEDTIME    valsartan (DIOVAN) 160 mg Oral Tablet Take 1 Tablet (160 mg total) by mouth Once a day    vitamin B complex Oral Tablet Take 1 Tablet by mouth Once a day     Assessment/Plan  Problem List Items  Addressed This Visit    None    Bilateral non-obstructing nephrolithiasis, 55m right, 730mleft  I discussed the differential diagnosis, pathophysiology and nature of asymptomatic non-obstructing renal stones as well as the natural history  Based on the current best-available data [J Urol. 2015 Apr;193(4):1265-9] with an average follow-up of 3.5 years:  Approximately 60% of patients will remain asymptomatic  Less than 30% will experience renal colic  Less than 2014%ill require operative intervention for pain  Spontaneous stone passage will occur in 7% of cases  Importantly, patient was counseled on risk of silent obstruction causing hydronephrosis and requiring intervention    Patient was counseled on the need for possible future treatment for urinary calculi including observation, extracorporeal shockwave lithotripsy (ESWL), ureteroscopic extraction/intracorporeal laser lithotripsy, percutaneous nephrolithotomy (PCNL), or open ureterolithotomy/nephrolithotomy. The risks, advantages, and disadvantages of each were discussed.   I reviewed the recent 2014 American Urological Association guideline on "Medical Management of Kidney Stones" and specifically discussed dietary therapies including:  Increase fluid intake to achieve urine output of at least 2.5 liters daily  Limit sodium intake to no more than 100 mEq (2,300 mg) per day  Consume 1,000-1,200 mg of dietary calcium  per day  Limit oxalate-rich foods (beets, spinach, rhubarb, nuts, chocolate)  Limit non-dairy animal protein  Encouraged incresed fruit and vegetable intake  Patient was provided with written stone prevention recommendations.  Patient was also counseled on the benefits of further serum and 24 hour urine testing to identify the metabolic disorders responsible for recurrent stone disease      Right distal ureterolithiasis, 53m, status post right nephrostomy tube placement on 07/29/2022 due to septic shock 2/2 urosepsis  I discussed the differential  diagnosis, pathophysiology and nature of urolithiasis and recurrent stone disease.  Patient was counseled on available treatment options in the management of their 5 mm calculus located within the right distal ureter  Conservative, medical expulsive therapy consisting of alpha-blocker therapy, analgesia and directed fluids which is generally reserved for ureteral stones <5 mm without complicating factors (infection, intractable pain/nausea)  The patient was counseled regarding treatment options for urinary calculi including observation, extracorporeal shockwave lithotripsy (ESWL), ureteroscopic extraction/intracorporeal laser lithotripsy, percutaneous nephrolithotomy (PCNL), or open ureterolithotomy/nephrolithotomy. The risks, advantages, and disadvantages of each were discussed.   Patient understands that the risks include but are not limited to bleeding, infection, damage to adjacent tissues, renal fracture/contusion/hemorrhage, anesthesia risks, loss of the kidney, steinstrasse, therapeutic failure, possible need for post-operative drains/stents, need for additional procedures.  After further discussion, patient has elected to undergo cystoscopy, ureteroscopy with laser lithotripsy, stone manipulation and ureteral stent insertion at the next available date  Plan for left stone special at the same time  Two weeks later we will plan for right stone special to remove right kidney stone           JLandis Gandy DO     A combined total of 45 minutes were spent preparing to see the patient, reviewing previous records, ordering tests/medications/procedures, documenting the clinical encounter as well as performing a medically appropriate evaluation and independently interpreting results and communicating them to the patient/family/caregiver as specifically outlined above in the impression and plan.

## 2022-09-11 ENCOUNTER — Other Ambulatory Visit (INDEPENDENT_AMBULATORY_CARE_PROVIDER_SITE_OTHER): Payer: Self-pay | Admitting: Student in an Organized Health Care Education/Training Program

## 2022-09-11 LAB — URINE CULTURE,ROUTINE: URINE CULTURE: >100000 — AB

## 2022-09-11 MED ORDER — NITROFURANTOIN MONOHYDRATE/MACROCRYSTALS 100 MG CAPSULE
100.0000 mg | ORAL_CAPSULE | Freq: Two times a day (BID) | ORAL | 0 refills | Status: DC
Start: 2022-09-11 — End: 2022-09-26

## 2022-09-12 ENCOUNTER — Other Ambulatory Visit (HOSPITAL_COMMUNITY): Payer: Medicare HMO

## 2022-09-12 ENCOUNTER — Other Ambulatory Visit: Payer: Self-pay

## 2022-09-12 ENCOUNTER — Ambulatory Visit (HOSPITAL_COMMUNITY): Payer: Medicare HMO | Admitting: Anesthesiology

## 2022-09-12 ENCOUNTER — Inpatient Hospital Stay
Admission: RE | Admit: 2022-09-12 | Discharge: 2022-09-12 | Disposition: A | Discharge status: Home / Self Care | Payer: Medicare HMO | Source: Ambulatory Visit | Attending: Student in an Organized Health Care Education/Training Program | Admitting: Student in an Organized Health Care Education/Training Program

## 2022-09-12 ENCOUNTER — Encounter (HOSPITAL_COMMUNITY)
Admission: RE | Disposition: A | Discharge status: Home / Self Care | Payer: Self-pay | Source: Ambulatory Visit | Attending: Student in an Organized Health Care Education/Training Program

## 2022-09-12 ENCOUNTER — Encounter (HOSPITAL_COMMUNITY): Payer: Self-pay | Admitting: Student in an Organized Health Care Education/Training Program

## 2022-09-12 DIAGNOSIS — N202 Calculus of kidney with calculus of ureter: Secondary | ICD-10-CM | POA: Insufficient documentation

## 2022-09-12 HISTORY — DX: Calculus of kidney: N20.0

## 2022-09-12 HISTORY — DX: Pneumonia, unspecified organism: J18.9

## 2022-09-12 HISTORY — DX: Unspecified malignant neoplasm of skin, unspecified: C44.90

## 2022-09-12 SURGERY — CYSTOSCOPY WITH LASER LITHOTRIPSY
Anesthesia: General | Site: Kidney | Laterality: Bilateral | Wound class: Clean Contaminated Wounds-The respiratory, GI, Genital, or urinary

## 2022-09-12 MED ORDER — EPHEDRINE SULFATE 50 MG/ML INTRAVENOUS SOLUTION
Freq: Once | INTRAVENOUS | Status: DC | PRN
Start: 2022-09-12 — End: 2022-09-12
  Administered 2022-09-12: 10 mg via INTRAVENOUS
  Administered 2022-09-12: 5 mg via INTRAVENOUS

## 2022-09-12 MED ORDER — FENTANYL (PF) 50 MCG/ML INJECTION WRAPPER
INJECTION | Freq: Once | INTRAMUSCULAR | Status: DC | PRN
Start: 2022-09-12 — End: 2022-09-12
  Administered 2022-09-12: 100 ug via INTRAVENOUS

## 2022-09-12 MED ORDER — FAMOTIDINE (PF) 20 MG/2 ML INTRAVENOUS SOLUTION
20.0000 mg | Freq: Once | INTRAVENOUS | Status: AC
Start: 2022-09-12 — End: 2022-09-12
  Administered 2022-09-12: 20 mg via INTRAVENOUS

## 2022-09-12 MED ORDER — DIAZEPAM 5 MG/ML INJECTION SYRINGE
2.5000 mg | INJECTION | Freq: Four times a day (QID) | INTRAMUSCULAR | Status: DC | PRN
Start: 2022-09-12 — End: 2022-09-12

## 2022-09-12 MED ORDER — PROCHLORPERAZINE EDISYLATE 10 MG/2 ML (5 MG/ML) INJECTION SOLUTION
5.0000 mg | Freq: Once | INTRAMUSCULAR | Status: DC | PRN
Start: 2022-09-12 — End: 2022-09-12

## 2022-09-12 MED ORDER — ONDANSETRON HCL (PF) 4 MG/2 ML INJECTION SOLUTION
4.0000 mg | Freq: Three times a day (TID) | INTRAMUSCULAR | Status: DC | PRN
Start: 2022-09-12 — End: 2022-09-12

## 2022-09-12 MED ORDER — LIDOCAINE (PF) 100 MG/5 ML (2 %) INTRAVENOUS SYRINGE
INJECTION | Freq: Once | INTRAVENOUS | Status: DC | PRN
Start: 2022-09-12 — End: 2022-09-12
  Administered 2022-09-12: 80 mg via INTRAVENOUS

## 2022-09-12 MED ORDER — KETOROLAC 30 MG/ML (1 ML) INJECTION SOLUTION
Freq: Once | INTRAMUSCULAR | Status: DC | PRN
Start: 2022-09-12 — End: 2022-09-12
  Administered 2022-09-12: 15 mg via INTRAVENOUS

## 2022-09-12 MED ORDER — LACTATED RINGERS INTRAVENOUS SOLUTION
INTRAVENOUS | Status: DC
Start: 2022-09-12 — End: 2022-09-12

## 2022-09-12 MED ORDER — ONDANSETRON HCL (PF) 4 MG/2 ML INJECTION SOLUTION
4.0000 mg | Freq: Once | INTRAMUSCULAR | Status: DC | PRN
Start: 2022-09-12 — End: 2022-09-12

## 2022-09-12 MED ORDER — DEXAMETHASONE SODIUM PHOSPHATE 4 MG/ML INJECTION SOLUTION
INTRAMUSCULAR | Status: AC
Start: 2022-09-12 — End: 2022-09-12
  Filled 2022-09-12: qty 1

## 2022-09-12 MED ORDER — FENTANYL (PF) 50 MCG/ML INJECTION SOLUTION
INTRAMUSCULAR | Status: AC
Start: 2022-09-12 — End: 2022-09-12
  Filled 2022-09-12: qty 2

## 2022-09-12 MED ORDER — MIDAZOLAM 5 MG/ML INJECTION WRAPPER
INTRAMUSCULAR | Status: AC
Start: 2022-09-12 — End: 2022-09-12
  Filled 2022-09-12: qty 1

## 2022-09-12 MED ORDER — SODIUM CHLORIDE 0.9 % INTRAVENOUS SOLUTION
5.0000 mg/kg | Freq: Once | INTRAVENOUS | Status: AC
Start: 2022-09-12 — End: 2022-09-12
  Administered 2022-09-12: 320 mg via INTRAVENOUS
  Filled 2022-09-12: qty 8

## 2022-09-12 MED ORDER — MIDAZOLAM 5 MG/ML INJECTION WRAPPER
2.0000 mg | Freq: Once | INTRAMUSCULAR | Status: DC | PRN
Start: 2022-09-12 — End: 2022-09-12
  Administered 2022-09-12: 2 mg via INTRAVENOUS

## 2022-09-12 MED ORDER — IPRATROPIUM 0.5 MG-ALBUTEROL 3 MG (2.5 MG BASE)/3 ML NEBULIZATION SOLN
3.0000 mL | INHALATION_SOLUTION | Freq: Once | RESPIRATORY_TRACT | Status: DC | PRN
Start: 2022-09-12 — End: 2022-09-12

## 2022-09-12 MED ORDER — FAMOTIDINE (PF) 20 MG/2 ML INTRAVENOUS SOLUTION
INTRAVENOUS | Status: AC
Start: 2022-09-12 — End: 2022-09-12
  Filled 2022-09-12: qty 2

## 2022-09-12 MED ORDER — ALBUTEROL SULFATE 2.5 MG/3 ML (0.083 %) SOLUTION FOR NEBULIZATION
2.5000 mg | INHALATION_SOLUTION | Freq: Once | RESPIRATORY_TRACT | Status: DC | PRN
Start: 2022-09-12 — End: 2022-09-12

## 2022-09-12 MED ORDER — SUGAMMADEX 100 MG/ML INTRAVENOUS SOLUTION
Freq: Once | INTRAVENOUS | Status: DC | PRN
Start: 2022-09-12 — End: 2022-09-12
  Administered 2022-09-12: 300 mg via INTRAVENOUS

## 2022-09-12 MED ORDER — PHENYLEPHRINE 10 MG/ML INJECTION SOLUTION
Freq: Once | INTRAMUSCULAR | Status: DC | PRN
Start: 2022-09-12 — End: 2022-09-12
  Administered 2022-09-12: 200 ug via INTRAVENOUS
  Administered 2022-09-12 (×2): 100 ug via INTRAVENOUS

## 2022-09-12 MED ORDER — PROPOFOL 10 MG/ML IV BOLUS
INJECTION | Freq: Once | INTRAVENOUS | Status: DC | PRN
Start: 2022-09-12 — End: 2022-09-12
  Administered 2022-09-12: 200 mg via INTRAVENOUS

## 2022-09-12 MED ORDER — FENTANYL (PF) 50 MCG/ML INJECTION WRAPPER
50.0000 ug | INJECTION | INTRAMUSCULAR | Status: DC | PRN
Start: 2022-09-12 — End: 2022-09-12

## 2022-09-12 MED ORDER — SODIUM CHLORIDE 0.9 % (FLUSH) INJECTION SYRINGE
3.0000 mL | INJECTION | Freq: Three times a day (TID) | INTRAMUSCULAR | Status: DC
Start: 2022-09-12 — End: 2022-09-12

## 2022-09-12 MED ORDER — GENTAMICIN IV - PHARMACIST TO DOSE PER PROTOCOL
Freq: Once | Status: DC | PRN
Start: 2022-09-12 — End: 2022-09-12

## 2022-09-12 MED ORDER — SODIUM CHLORIDE 0.9 % (FLUSH) INJECTION SYRINGE
3.0000 mL | INJECTION | INTRAMUSCULAR | Status: DC | PRN
Start: 2022-09-12 — End: 2022-09-12

## 2022-09-12 MED ORDER — ONDANSETRON HCL (PF) 4 MG/2 ML INJECTION SOLUTION
INTRAMUSCULAR | Status: AC
Start: 2022-09-12 — End: 2022-09-12
  Filled 2022-09-12: qty 2

## 2022-09-12 MED ORDER — IOHEXOL 180 MG IODINE/ML INTRATHECAL SOLUTION
Freq: Once | INTRATHECAL | Status: DC | PRN
Start: 2022-09-12 — End: 2022-09-12
  Administered 2022-09-12: 15 mL via INTRATHECAL

## 2022-09-12 MED ORDER — DEXAMETHASONE SODIUM PHOSPHATE 4 MG/ML INJECTION SOLUTION
4.0000 mg | Freq: Once | INTRAMUSCULAR | Status: AC
Start: 2022-09-12 — End: 2022-09-12
  Administered 2022-09-12: 4 mg via INTRAVENOUS

## 2022-09-12 MED ORDER — FENTANYL (PF) 50 MCG/ML INJECTION WRAPPER
25.0000 ug | INJECTION | INTRAMUSCULAR | Status: DC | PRN
Start: 2022-09-12 — End: 2022-09-12

## 2022-09-12 MED ORDER — ROCURONIUM 10 MG/ML INTRAVENOUS SOLUTION
Freq: Once | INTRAVENOUS | Status: DC | PRN
Start: 2022-09-12 — End: 2022-09-12
  Administered 2022-09-12: 30 mg via INTRAVENOUS

## 2022-09-12 MED ORDER — ONDANSETRON HCL (PF) 4 MG/2 ML INJECTION SOLUTION
4.0000 mg | Freq: Once | INTRAMUSCULAR | Status: AC
Start: 2022-09-12 — End: 2022-09-12
  Administered 2022-09-12: 4 mg via INTRAVENOUS

## 2022-09-12 SURGICAL SUPPLY — 56 items
ABSORBENT FLOOR MAT ROLL WTRPRF BACKSHEET (MED SURG SUPPLIES) ×1
ADAPTER CATH 9FR SLFSL FEMALE LL CKFL STRL DISP (UROLOGICAL SUPPLIES) ×2 IMPLANT
ADAPTER CATH CKFL SLFSL STD FE_MALE LL 9FR INSTR STRL DISP (UROLOGICAL SUPPLIES) ×2
BAG DRAIN STRL SKTRN CSTL (UROLOGICAL SUPPLIES) ×1 IMPLANT
BASKET STONE RTRVL 120CM 12MM 1.9FR 0TP URET NITINOL 4WR FLAT DIST SURF KNT TPLS STRL LF  DISP (UROLOGICAL SUPPLIES) ×1 IMPLANT
BASKET STONE RTRVL 120CM 12MM_1.9FR 0TP URET NITINOL 4WR (UROLOGICAL SUPPLIES) ×1
CLEANER INSTR PREPZYME MUL-TRD CONTAINR NARSL NEUT PH BDGR (MISCELLANEOUS PT CARE ITEMS) ×1
CLEANER INSTR PREPZYME MUL-TRD CONTAINR NARSL NEUT PH BDGR 22OZ (MISCELLANEOUS PT CARE ITEMS) ×1
CONV USE ITEM 329146 - CLEANER INSTR PREPZYME MUL-TRD CONTAINR NARSL NEUT PH BDGR 22OZ (MISCELLANEOUS PT CARE ITEMS) ×1 IMPLANT
CONV USE ITEM 339060 - PACK SURG CSCP STRL TBRN LF (CUSTOM TRAYS & PACK) ×1 IMPLANT
DILATOR URET 6-12FR DDRGL STRL_LF DISP NTNGHM 1STP 70CM (MED SURG SUPPLIES) ×1
DILATOR URET NTNGHM 1STP DDRGL 70CM 6-12FR STRL LF  DISP (MED SURG SUPPLIES) ×1 IMPLANT
FIBER LASER DORNIER SINGLEFLEX 270UM HO FLAT TIP DISP (SURGICAL LASER SUPPLIES) ×1 IMPLANT
FIBER LSR DORNIER SINGLEFLEX_270UM HO FLT TIP DISP (LASER) ×1
GLOVE SURG 6 LF  PF BEAD CUF STRL CRM 11.3IN PROTEXIS PI (GLOVES AND ACCESSORIES) ×1
GLOVE SURG 6 LF  PF BEAD CUF STRL CRM 11.3IN PROTEXIS PLISPRN THK9.1 MIL (GLOVES AND ACCESSORIES) ×1 IMPLANT
GLOVE SURG 6.5 LF  PF BEAD CUF STRL CRM 11.3IN PROTEXIS PI (GLOVES AND ACCESSORIES) ×1
GLOVE SURG 6.5 LF  PF BEAD CUF STRL CRM 11.3IN PROTEXIS PI PLISPRN THK9.1 MIL (GLOVES AND ACCESSORIES) ×1 IMPLANT
GLOVE SURG 7.5 LF  PF BEAD CUF STRL CRM 11.8IN PROTEXIS PI (GLOVES AND ACCESSORIES) ×1
GLOVE SURG 7.5 LF  PF BEAD CUF STRL CRM 11.8IN PROTEXIS PI PLISPRN THK9.1 MIL (GLOVES AND ACCESSORIES) ×1 IMPLANT
GLOVE SURG 7.5 LF  PF SMOOTH BEAD CUF STRL GRN 12IN SENSICARE PI GRN PLISPRN PLMR ALOE THK7.9 MIL (GLOVES AND ACCESSORIES) IMPLANT
GLOVE SURG 7.5 LF PF SMOOTH BEAD CUF STRL GRN 12IN (GLOVES AND ACCESSORIES)
GOWN SURG 2XL XLNG LGTH L3 HKLP CLSR RGLN SLEEVE TWL STRL LF (DRAPE/PACKS/SHEETS/OR TOWEL) ×1
GOWN SURG 2XL XLNG LGTH L3 HKLP CLSR RGLN SLEEVE TWL STRL LF  DISP GRN AERO BLU PRFRM FBRC (DRAPE/PACKS/SHEETS/OR TOWEL) ×1 IMPLANT
GOWN SURG XL STD LGTH L3 NONREINFORCE HKLP CLSR TWL STRL LF (DRAPE/PACKS/SHEETS/OR TOWEL) ×2
GOWN SURG XL STD LGTH L3 NONREINFORCE HKLP CLSR TWL STRL LF  DISP BLU SPECTRUM SMS (DRAPE/PACKS/SHEETS/OR TOWEL) ×2
GOWN SURG XL STD LGTH L3 NONREINFORCE HKLP CLSR TWL STRL LF DISP BLU SPECTRUM SMS (DRAPE/PACKS/SHEETS/OR TOWEL) ×2 IMPLANT
GW URO .035IN 150CM 3CM SENSOR STR FLX TP RADOPQ NITINOL SS HDRPH PTFE URET LF (UROLOGICAL SUPPLIES) ×1 IMPLANT
GW URO .035IN 150CM 3CM SENSOR_DUALFLX 10CM RADOPQ KINK RST (UROLOGICAL SUPPLIES) ×1
GW URO .035IN 150CM 3CM ZIPWR STR RADOPQ STD SHAFT NITINOL (UROLOGICAL SUPPLIES) ×1
GW URO .035IN 150CM 3CM ZIPWR STR RADOPQ STD SHAFT NITINOL HDRPH URET LF (UROLOGICAL SUPPLIES) ×1 IMPLANT
LABEL MED CORRECT MED LABELING SYS 4 FLG 2 SHEET 24 PRPRNT (MED SURG SUPPLIES) ×1
LABEL MED CORRECT MED LABELING SYS 4 FLG 2 SHEET 24 PRPRNT STRL (MED SURG SUPPLIES) ×1 IMPLANT
MAT FLR 110X29IN STD ABSRBENT WATERPROF BACKSHEET RL (MED SURG SUPPLIES) ×1 IMPLANT
PACK SURG CSCP STRL TBRN LF (CUSTOM TRAYS & PACK) ×1
SET .241IN 96IN 2 NONVENT PIERCE PIN 2 PINCH CLAMP SIGHT CHAMBER Y CONN IRRG 85ML TUR IV STRL LF (IV TUBING & ACCESSORIES) ×1 IMPLANT
SET TURP 654301 CS/20 (IV TUBING & ACCESSORIES) ×1
SHEATH ACCESS 12FR DILATION CONT WRK CHNL SM FLXB SCP LFRIC SURF FLEXOR URET HDRPH 35CM STRL LF (UROLOGICAL SUPPLIES) ×1 IMPLANT
SHEATH URETERAL 12FR X 35CM_G19168 (UROLOGICAL SUPPLIES) ×1
SOL IRRG 0.9% NACL 2000ML PRSV FR N-PYRG FLXB CONTAINR STRL (MEDICATIONS/SOLUTIONS) ×1
SOL IRRG 0.9% NACL 2000ML PRSV FR N-PYRG FLXB CONTAINR STRL LF (MEDICATIONS/SOLUTIONS) ×1 IMPLANT
SOL IV 0.9% NACL 1000ML STRL PRSV FR FLXB CONTAINR LF (MEDICATIONS/SOLUTIONS) ×1 IMPLANT
SPONGE GAUZE 4X4IN MDCHC COTTON 12 PLY TY 7 LF  STRL DISP (WOUND CARE SUPPLY) ×1 IMPLANT
SPONGE GAUZE 4X4IN MDCHC COTTO_N 12 PLY TY 7 LF STRL DISP (WOUND CARE/ENTEROSTOMAL SUPPLY) ×1
STENT CONTOUR 6FR 22CM URET TAPER TIP BLADDER MRK LOW PROF BRD SUT PRCFLX HDR+ PGTL CURVE LF (STENTS UROLOGICAL) ×2 IMPLANT
STENT CONTOUR 6FR 22CM URET TP_R TIP BLA MRK LO PRFL BRD SUT (STENTS UROLOGICAL)
STENT CONTOUR 6FR 24CM URET TP_R TIP BLA MRK LO PRFL BRD SUT (STENTS UROLOGICAL)
STENT CONTOUR 6FR 26CM URET TP_R TIP BLA MRK LO PRFL BRD SUT (STENTS UROLOGICAL)
STENT CONTOUR 6FR 28CM URET TAPER TIP BLADDER MRK LOW PROF (STENTS UROLOGICAL)
SYRINGE LL 10ML LF  STRL GRAD N-PYRG DEHP-FR PVC FREE MED DISP (MED SURG SUPPLIES) ×1 IMPLANT
SYRINGE LL 10ML LF STRL MED D_ISP (MED SURG SUPPLIES) ×1
SYRINGE LL 20ML STRL GRAD MED (MED SURG SUPPLIES) ×1
SYRINGE LL 20ML STRL GRAD MED DISP (MED SURG SUPPLIES) ×1 IMPLANT
TOWEL 24X16IN COTTON BLU DISP SURG STRL LF (DRAPE/PACKS/SHEETS/OR TOWEL) ×1 IMPLANT
WATER STRL 1000ML PLASTIC PR BTL LF (MED SURG SUPPLIES) ×2 IMPLANT
WATER STRL 1000ML PLASTIC PR B_TL LF (MED SURG SUPPLIES) ×2

## 2022-09-12 NOTE — Anesthesia Postprocedure Evaluation (Signed)
Anesthesia Post Op Evaluation    Patient: Leslie Hall  Procedure(s):  CYSTOSCOPY; LASER LITHOTRIPSY; BILATERAL URETEROSCOPY; BILATERAL RETROGRADE PYELOGRAM; BILATERAL STENT PLACEMENT, RIGHT NEPHROSTOMY REMOVAL    Last Vitals:Temperature: 36.6 C (97.9 F) (09/12/22 0928)  Heart Rate: 89 (09/12/22 0938)  BP (Non-Invasive): (!) 155/82 (09/12/22 7944)  Respiratory Rate: 13 (09/12/22 0938)  SpO2: 97 % (09/12/22 0938)    No notable events documented.      Patient location during evaluation: bedside       Patient participation: complete - patient participated  Level of consciousness: awake and alert    Pain management: satisfactory to patient  Airway patency: patent    Anesthetic complications: no  Cardiovascular status: hemodynamically stable  Respiratory status: acceptable  Hydration status: acceptable  Patient post-procedure temperature: Pt Normothermic   PONV Status: Absent

## 2022-09-12 NOTE — OR Nursing (Signed)
UROSTOMY BAG NOTED ON ARRIVAL TO THE OR WITH YELLOW/CLEAR URINE IN DRAINAGE BAG

## 2022-09-12 NOTE — Anesthesia Transfer of Care (Signed)
ANESTHESIA TRANSFER OF CARE   Leslie Hall is a 76 y.o. ,female, Weight: 63.5 kg (140 lb)   had Procedure(s):  CYSTOSCOPY; LASER LITHOTRIPSY; BILATERAL URETEROSCOPY; BILATERAL RETROGRADE PYELOGRAM; BILATERAL STENT PLACEMENT, RIGHT NEPHROSTOMY REMOVAL  performed  09/12/22   Primary Service: Landis Gandy, DO    Past Medical History:   Diagnosis Date    Hypertension     Kidney stones     Mixed hyperlipidemia     Pneumonia     Skin cancer     forehead      Allergy History as of 09/12/22       PENICILLINS         Noted Status Severity Type Reaction    01/23/22 1136 Crewey, [place], LPN 62/70/35 Active                 MELOXICAM         Noted Status Severity Type Reaction    01/23/22 1136 Crewey, [place], LPN 00/93/81 Active                 NIACIN         Noted Status Severity Type Reaction    01/23/22 1137 Crewey, Aldona Bar, LPN 82/99/37 Active                 LATEX         Noted Status Severity Type Reaction    09/12/22 0758 Blythe Stanford, RN 07/28/22 Active Medium  Rash    07/28/22 1439 Dillow-Price, Janett Billow, RN 07/28/22 Active                 CEFDINIR         Noted Status Severity Type Reaction    09/07/22 1546 Sharen Heck, LPN 16/96/78 Active Low  Itching              ACETAMINOPHEN         Noted Status Severity Type Reaction    09/07/22 1547 Sharen Heck, LPN 93/81/01 Active                 AMITRIPTYLINE         Noted Status Severity Type Reaction    09/07/22 1547 Sharen Heck, LPN 75/10/25 Active                 MORPHINE         Noted Status Severity Type Reaction    09/12/22 0709 Marijean Niemann, RN 09/07/22 Active High   Other Adverse Reaction (Add comment)    Comments: BP drop shaking      09/12/22 0709 Marijean Niemann, RN 09/07/22 Active    Other Adverse Reaction (Add comment)    Comments: BP drop shaking      09/07/22 1548 Sharen Heck, LPN 85/27/78 Active   Malignant Hyperthermia              CIPROFLOXACIN         Noted Status Severity Type Reaction    09/07/22 1549 Sharen Heck, LPN  24/23/53 Active                     I completed my transfer of care / handoff to the receiving personnel during which we discussed:  Access, Airway, All key/critical aspects of case discussed, Analgesia, Antibiotics, Expectation of post procedure, Fluids/Product, Gave opportunity for questions and acknowledgement of understanding, Labs and PMHx      Post Location: PACU  Last OR Temp: Temperature: 36.6 C (97.9 F)  ABG:  POTASSIUM   Date Value Ref Range Status   09/07/2022 4.4 3.5 - 5.1 mmol/L Final     KETONES   Date Value Ref Range Status   09/07/2022 Negative Negative, Trace mg/dL Final     KLEBSIELLA OXYTOCA   Date Value Ref Range Status   07/28/2022 Not Detected Not Detected Final     KLEBSIELLA PNEUMONIAE   Date Value Ref Range Status   07/28/2022 Not Detected Not Detected Final     KLEBSIELLA PNEUMONIAE CRE   Date Value Ref Range Status   07/28/2022 Not Detected Not Detected, N/A Final     CALCIUM   Date Value Ref Range Status   09/07/2022 10.2 8.6 - 10.3 mg/dL Final     CALCIUM OXALATE CRYSTALS   Date Value Ref Range Status   09/03/2022 Rare (A) (none) /hpf Final     Calculated P Axis   Date Value Ref Range Status   07/25/2022 75 degrees Final     Calculated R Axis   Date Value Ref Range Status   07/25/2022 50 degrees Final     Calculated T Axis   Date Value Ref Range Status   07/25/2022 70 degrees Final     Airway:* No LDAs found *  Blood pressure (!) 159/95, pulse 91, temperature 36.6 C (97.9 F), resp. rate 14, height 1.702 m ('5\' 7"'$ ), weight 63.5 kg (140 lb), SpO2 100%.

## 2022-09-12 NOTE — Nurses Notes (Signed)
0711: IN PT'S ALLERGY LISTS, UNDER MORPHINE ALLERGY, MALIGNANT HYPERTHERMIA LISTED AS REACTION. ANESTHESIA CALLED BY D. KELLER RN CHARGE. ANESTHESIA SAID MORPHINE WAS NOT A TRIGGER FOR MH.

## 2022-09-12 NOTE — H&P (Signed)
[facility]  Urology Admission History and Physical      Leslie Hall, Leslie Hall, 76 y.o. female  Encounter Start Date:  09/12/2022  Inpatient Admission Date:    Date of Birth:  1946-06-04    PCP: Octaviano Batty, MD    Information Obtained from: patient  Chief Complaint: ureterolithiasis       HPI:    Leslie Hall is a pleasant 76 year old female who recently experienced an episode of urosepsis and obstructive uropathy secondary to a right distal ureteral calculus requiring transfer to [facility] with a nephrostomy tube placed on July 29, 2022.  She presents today for follow-up.  This was her 1st episode of a kidney stone.  Her CT also demonstrated bilateral nonobstructing kidney stones. She denies fevers, chills, nausea, vomiting, hematuria, dysuria, flank pain, incontinence, dribbling, hesitancy, suprapubic pain, headaches, vision changes, shortness of breath, chest pain.        ROS:  MUST comment on all "Abnormal" findings   ROS Other than ROS in the HPI, all other systems were negative.      PAST MEDICAL/ FAMILY/ SOCIAL HISTORY:       Past Medical History:   Diagnosis Date    Hypertension     Kidney stones     Mixed hyperlipidemia     Pneumonia     Skin cancer     forehead         Allergies   Allergen Reactions    Amitriptyline     Ciprofloxacin     Latex     Mobic [Meloxicam]     Morphine Malignant Hyperthermia    Niacin     Penicillins     Tylenol [Acetaminophen]     Cefdinir Itching     Medications Prior to Admission       Prescriptions    amLODIPine (NORVASC) 5 mg Oral Tablet    Take 1 Tablet (5 mg total) by mouth Once a day    aspirin (ECOTRIN) 81 mg Oral Tablet, Delayed Release (E.C.)    Take by mouth Once a day    famotidine (PEPCID) 40 mg Oral Tablet    Take 1 Tablet (40 mg total) by mouth Every night    Patient not taking:  Reported on 09/12/2022    montelukast (SINGULAIR) 10 mg Oral Tablet    Take 1 Tablet (10 mg total) by mouth Once a day    nitrofurantoin monohyd/m-cryst (MACROBID) 100 mg  Oral Capsule    Take 1 Capsule (100 mg total) by mouth Twice daily for 10 days    omeprazole (PRILOSEC) 40 mg Oral Capsule, Delayed Release(E.C.)    Take 1 Capsule (40 mg total) by mouth Once a day    Patient not taking:  Reported on 09/12/2022    tamsulosin (FLOMAX) 0.4 mg Oral Capsule    TAKE 1 CAPSULE BY MOUTH ONCE DAILY AT BEDTIME    trimethoprim-sulfamethoxazole (BACTRIM DS) 160-'800mg'$  per tablet    Take 1 Tablet (160 mg total) by mouth Twice daily           Gentamicin IV - Pharmacist to Dose per Protocol, , Does not apply, Daily PRN  LR premix infusion, , Intravenous, Continuous  NS flush syringe, 3 mL, Intracatheter, Q8HRS  NS flush syringe, 3 mL, Intracatheter, Q1H PRN      Past Surgical History:   Procedure Laterality Date    HEEL SPUR SURGERY      HX BREAST BIOPSY Bilateral     yrs ago -  HX HYSTERECTOMY      HX TRIGGER FINGER RELEASE      SKIN CANCER EXCISION      forehead         Social History     Tobacco Use    Smoking status: Never    Smokeless tobacco: Never   Vaping Use    Vaping Use: Never used   Substance Use Topics    Alcohol use: Never    Drug use: Never       Gen: NAD, alert  Pulm: unlabored at rest  CV: palpable pulses  Abd: soft, Nt/ND  GU: no suprapubic tenderness, no CVAT      Assessment & Plan:   There are no active hospital problems to display for this patient.    Bilateral non-obstructing nephrolithiasis, 90m right, 777mleft  I discussed the differential diagnosis, pathophysiology and nature of asymptomatic non-obstructing renal stones as well as the natural history  Based on the current best-available data [J Urol. 2015 Apr;193(4):1265-9] with an average follow-up of 3.5 years:  Approximately 60% of patients will remain asymptomatic  Less than 30% will experience renal colic  Less than 2081%ill require operative intervention for pain  Spontaneous stone passage will occur in 7% of cases  Importantly, patient was counseled on risk of silent obstruction causing hydronephrosis and  requiring intervention    Patient was counseled on the need for possible future treatment for urinary calculi including observation, extracorporeal shockwave lithotripsy (ESWL), ureteroscopic extraction/intracorporeal laser lithotripsy, percutaneous nephrolithotomy (PCNL), or open ureterolithotomy/nephrolithotomy. The risks, advantages, and disadvantages of each were discussed.   I reviewed the recent 2014 American Urological Association guideline on "Medical Management of Kidney Stones" and specifically discussed dietary therapies including:  Increase fluid intake to achieve urine output of at least 2.5 liters daily  Limit sodium intake to no more than 100 mEq (2,300 mg) per day  Consume 1,000-1,200 mg of dietary calcium per day  Limit oxalate-rich foods (beets, spinach, rhubarb, nuts, chocolate)  Limit non-dairy animal protein  Encouraged incresed fruit and vegetable intake  Patient was provided with written stone prevention recommendations.  Patient was also counseled on the benefits of further serum and 24 hour urine testing to identify the metabolic disorders responsible for recurrent stone disease        Right distal ureterolithiasis, 57m53mstatus post right nephrostomy tube placement on 07/29/2022 due to septic shock 2/2 urosepsis  I discussed the differential diagnosis, pathophysiology and nature of urolithiasis and recurrent stone disease.  Patient was counseled on available treatment options in the management of their 5 mm calculus located within the right distal ureter  Conservative, medical expulsive therapy consisting of alpha-blocker therapy, analgesia and directed fluids which is generally reserved for ureteral stones <5 mm without complicating factors (infection, intractable pain/nausea)  The patient was counseled regarding treatment options for urinary calculi including observation, extracorporeal shockwave lithotripsy (ESWL), ureteroscopic extraction/intracorporeal laser lithotripsy, percutaneous  nephrolithotomy (PCNL), or open ureterolithotomy/nephrolithotomy. The risks, advantages, and disadvantages of each were discussed.   Patient understands that the risks include but are not limited to bleeding, infection, damage to adjacent tissues, renal fracture/contusion/hemorrhage, anesthesia risks, loss of the kidney, steinstrasse, therapeutic failure, possible need for post-operative drains/stents, need for additional procedures.  After further discussion, patient has elected to undergo cystoscopy, ureteroscopy with laser lithotripsy, stone manipulation and ureteral stent insertion at the next available date  Plan for left stone special at the same time  Two weeks later we will plan for right stone special  to remove right kidney stone         Landis Gandy, DO

## 2022-09-12 NOTE — OR Surgeon (Signed)
[facility]                                              OPERATIVE NOTE    Patient Name: Leslie Hall, Leslie Hall Number: [id number]  Date of Service: 09/12/2022   Date of Birth: 1946-06-18    All elements must be documented.    Pre-Operative Diagnosis:Right ureterolithiasis, left nephrolithiasis   Post-Operative Diagnosis:same  Procedure(s)/Description:  Cystoscopy, bilateral retrograde pyelogram, right ureteroscopy stone manipulation laser lithotripsy, right ureteral stent insertion, right nephrostomy tube removal, left ureteroscopy, left pyeloscopy, left laser lithotripsy stone manipulation, left ureteral stent insertion  Findings:  6 mm right distal ureteral calculus, 7 mm left nonobstructing kidney stone    Patient prepped and draped in sterile fashion.  Her right nephrostomy tube was clamped with a hemostat.  A well lubricated cystoscope was inserted into the urethra.  Systematic evaluation of the bladder did not reveal any tumors polyps diverticula or trabeculations.  Bilateral ureteral orifices in orthotopic position.  A gentle retrograde pyelogram was carried out and this demonstrated a filling defect in the distal ureter consistent with the patient's recent CT scan demonstrating a right distal ureteral calculus.  I attempted to place a zip wire adjacent to this however it was occluding the ureter completely and I was unable to safely pass a zip wire.  I then inserted a semi-rigid ureteroscope into the right distal ureter and the stone was immediately identified.  This was nudged into a more appropriate configuration and adjacent to this is zip wire was placed.  The ureteroscope was then reinserted and the stone was fragmented into manageable pieces under direct vision with a holmium laser and these pieces were extracted under direct vision.  After this the ureteroscope was reinserted this time to the renal pelvis slowly withdrawn.  There was no evidence of any other for further  stones.  In standard Seldinger fashion a ureteral stent was placed in the right side with satisfactory proximal distal curl noted on fluoroscopy.  Attention was then turned to the left kidney and a gentle left retrograde pyelogram was carried out which did not demonstrate any hydronephrosis or filling defects however outlined the calyceal anatomy very nicely in his 7 mm nonobstructing stone was identified.  A sensor and a zip wire were placed in the left ureter under fluoroscopic guidance and over the sensor wire under fluoroscopic guidance again and access sheath was placed without difficulty.  Through this a flexible ureteroscope was inserted and upon entering the renal pelvis the calices were systematically evaluated and the stone was identified in the midpole.  This was fragmented under direct vision and the stone fragmented extremely nicely and created a very fine particulate.  I then changed the laser settings to a popcorn setting to further decrease the size of these fragments.  After completing the popcorn and the remaining fragments were much less than 1 mm in size and unable to be grasped with a ZeroTip Nitinol basket.  I then re-evaluated the calices and there was no further stones identified.  I removed the access sheath under direct endoscopic vision with the nose of the flexible ureteroscope just distal to the end of the access sheath.  There was no ureteral abrasions or ureteral injuries identified.  Because of the traumatic nature of an access sheath and the concern for post access sheath edema a  stent was placed in standard Seldinger fashion with satisfactory proximal distal curl noted on fluoroscopy.  The patient's bladder was emptied and the stones were sent for analysis.  Her nephrostomy tube was then removed without difficulty and a bandage was placed over the site.    Attending Surgeon: Landis Gandy  Assistant(s): NA    Anesthesia Type: * No anesthesia type entered *  Estimated Blood Loss:   Minimal  Blood Given: NA  Fluids Given: NS  Complications (unintended/unexpected/iatrogenic/accidental/inadvertent events):  NA  Characteristic Event (routinely expected or inherent to the difficulty/nature of the procedure): NA  Did the use of current and/or prior Anticoagulants impact the outcome of the case? No  Wound Class: Clean Contaminated Wounds -Respiratory, GI, Genital, or Urinary    Tubes:  bilateral 6Frx22cm ureteral stents  Drains: None  Specimens/ Cultures: right ureteral calculus  Implants: NA           Disposition: PACU - hemodynamically stable.  Condition: stable    Plan:  Right URS in 2 weeks       Landis Gandy, DO

## 2022-09-12 NOTE — Anesthesia Preprocedure Evaluation (Signed)
ANESTHESIA PRE-OP EVALUATION  Planned Procedure: CYSTOSCOPY; LASER LITHOTRIPSY; BILATERAL URETEROSCOPY; BILATERAL RETROGRADE PYELOGRAM; BILATERAL STENT PLACEMENT (Bilateral: Kidney)  Review of Systems                   Pulmonary   pneumonia,   Cardiovascular    Hypertension ,       GI/Hepatic/Renal    kidney stones        Endo/Other         Neuro/Psych/MS        Cancer                        Physical Assessment      Airway       Mallampati: III      Mouth Opening: good.            Dental                    Pulmonary           Cardiovascular             Other findings              Plan  ASA 2     Planned anesthesia type: general

## 2022-09-12 NOTE — Discharge Instructions (Addendum)
FOLLOW UP IN OFFICE AS DIRECTED    OFFICE TO CALL YOU WITH APPOINTMENT BUT CALL OFFICE TO CONFIRM ON MONDAY 09/17/22    CALL OFFICE FOR ANY ISSUES OR CONCERNS AT [phone]    SIGNS AND SYMPTOMS TO REPORT: TEMP GREATER THAN 100.4, EXCESSIVE, FOUL DRAINAGE, OR BLEEDING FROM SURGICAL SITE, UNCONTROLLED PAIN, OR INABILITY TO URINATE    MAY SHOWER TOMORROW IN THE EVENING    MAY RESUME HOME MEDICATIONS

## 2022-09-20 LAB — STONE ANALYSIS W/ IMAGE: STONE WEIGHT: 0.001 g

## 2022-09-26 ENCOUNTER — Ambulatory Visit (HOSPITAL_COMMUNITY): Payer: Medicare HMO | Admitting: Anesthesiology

## 2022-09-26 ENCOUNTER — Encounter (HOSPITAL_COMMUNITY)
Admission: RE | Disposition: A | Discharge status: Home / Self Care | Payer: Self-pay | Source: Ambulatory Visit | Attending: Student in an Organized Health Care Education/Training Program

## 2022-09-26 ENCOUNTER — Inpatient Hospital Stay
Admission: RE | Admit: 2022-09-26 | Discharge: 2022-09-26 | Disposition: A | Discharge status: Home / Self Care | Payer: Medicare HMO | Source: Ambulatory Visit | Attending: Student in an Organized Health Care Education/Training Program | Admitting: Student in an Organized Health Care Education/Training Program

## 2022-09-26 ENCOUNTER — Other Ambulatory Visit (HOSPITAL_COMMUNITY): Payer: Medicare HMO

## 2022-09-26 ENCOUNTER — Other Ambulatory Visit: Payer: Self-pay

## 2022-09-26 ENCOUNTER — Encounter (HOSPITAL_COMMUNITY): Payer: Self-pay | Admitting: Student in an Organized Health Care Education/Training Program

## 2022-09-26 DIAGNOSIS — N200 Calculus of kidney: Secondary | ICD-10-CM | POA: Insufficient documentation

## 2022-09-26 SURGERY — CYSTOSCOPY WITH LASER LITHOTRIPSY
Anesthesia: General | Site: Ureter | Laterality: Right | Wound class: Clean Contaminated Wounds-The respiratory, GI, Genital, or urinary

## 2022-09-26 MED ORDER — IOHEXOL 180 MG IODINE/ML INTRATHECAL SOLUTION
Freq: Once | INTRATHECAL | Status: DC | PRN
Start: 2022-09-26 — End: 2022-09-26
  Administered 2022-09-26: 10 mL via INTRAMUSCULAR

## 2022-09-26 MED ORDER — KETOROLAC 30 MG/ML (1 ML) INJECTION SOLUTION
Freq: Once | INTRAMUSCULAR | Status: DC | PRN
Start: 2022-09-26 — End: 2022-09-26
  Administered 2022-09-26: 15 mg via INTRAVENOUS

## 2022-09-26 MED ORDER — FAMOTIDINE (PF) 20 MG/2 ML INTRAVENOUS SOLUTION
INTRAVENOUS | Status: AC
Start: 2022-09-26 — End: 2022-09-26
  Filled 2022-09-26: qty 2

## 2022-09-26 MED ORDER — SODIUM CHLORIDE 0.9 % (FLUSH) INJECTION SYRINGE
3.0000 mL | INJECTION | Freq: Three times a day (TID) | INTRAMUSCULAR | Status: DC
Start: 2022-09-26 — End: 2022-09-26

## 2022-09-26 MED ORDER — ONDANSETRON HCL (PF) 4 MG/2 ML INJECTION SOLUTION
4.0000 mg | Freq: Once | INTRAMUSCULAR | Status: AC
Start: 2022-09-26 — End: 2022-09-26
  Administered 2022-09-26: 4 mg via INTRAVENOUS

## 2022-09-26 MED ORDER — IPRATROPIUM 0.5 MG-ALBUTEROL 3 MG (2.5 MG BASE)/3 ML NEBULIZATION SOLN
3.0000 mL | INHALATION_SOLUTION | Freq: Once | RESPIRATORY_TRACT | Status: DC | PRN
Start: 2022-09-26 — End: 2022-09-26

## 2022-09-26 MED ORDER — SODIUM CHLORIDE 0.9 % INTRAVENOUS SOLUTION
5.0000 mg/kg | Freq: Once | INTRAVENOUS | Status: AC
Start: 2022-09-26 — End: 2022-09-26
  Administered 2022-09-26: 320 mg via INTRAVENOUS
  Filled 2022-09-26: qty 8

## 2022-09-26 MED ORDER — FENTANYL (PF) 50 MCG/ML INJECTION WRAPPER
INJECTION | Freq: Once | INTRAMUSCULAR | Status: DC | PRN
Start: 2022-09-26 — End: 2022-09-26
  Administered 2022-09-26 (×2): 25 ug via INTRAVENOUS

## 2022-09-26 MED ORDER — FENTANYL (PF) 50 MCG/ML INJECTION WRAPPER
50.0000 ug | INJECTION | INTRAMUSCULAR | Status: DC | PRN
Start: 2022-09-26 — End: 2022-09-26

## 2022-09-26 MED ORDER — LACTATED RINGERS INTRAVENOUS SOLUTION
INTRAVENOUS | Status: DC
Start: 2022-09-26 — End: 2022-09-26

## 2022-09-26 MED ORDER — PROPOFOL 10 MG/ML IV BOLUS
INJECTION | Freq: Once | INTRAVENOUS | Status: DC | PRN
Start: 2022-09-26 — End: 2022-09-26
  Administered 2022-09-26: 120 mg via INTRAVENOUS

## 2022-09-26 MED ORDER — PHENYLEPHRINE 10 MG/ML INJECTION SOLUTION
Freq: Once | INTRAMUSCULAR | Status: DC | PRN
Start: 2022-09-26 — End: 2022-09-26
  Administered 2022-09-26: 100 ug via INTRAVENOUS
  Administered 2022-09-26: 200 ug via INTRAVENOUS

## 2022-09-26 MED ORDER — DEXAMETHASONE SODIUM PHOSPHATE 4 MG/ML INJECTION SOLUTION
INTRAMUSCULAR | Status: AC
Start: 2022-09-26 — End: 2022-09-26
  Filled 2022-09-26: qty 1

## 2022-09-26 MED ORDER — FENTANYL (PF) 50 MCG/ML INJECTION SOLUTION
INTRAMUSCULAR | Status: AC
Start: 2022-09-26 — End: 2022-09-26
  Filled 2022-09-26: qty 2

## 2022-09-26 MED ORDER — GENTAMICIN IV - PHARMACIST TO DOSE PER PROTOCOL
Freq: Every day | Status: DC | PRN
Start: 2022-09-26 — End: 2022-09-26

## 2022-09-26 MED ORDER — SODIUM CHLORIDE 0.9 % (FLUSH) INJECTION SYRINGE
3.0000 mL | INJECTION | INTRAMUSCULAR | Status: DC | PRN
Start: 2022-09-26 — End: 2022-09-26

## 2022-09-26 MED ORDER — LIDOCAINE (PF) 100 MG/5 ML (2 %) INTRAVENOUS SYRINGE
INJECTION | Freq: Once | INTRAVENOUS | Status: DC | PRN
Start: 2022-09-26 — End: 2022-09-26
  Administered 2022-09-26: 100 mg via INTRAVENOUS

## 2022-09-26 MED ORDER — ALBUTEROL SULFATE 2.5 MG/3 ML (0.083 %) SOLUTION FOR NEBULIZATION
2.5000 mg | INHALATION_SOLUTION | Freq: Once | RESPIRATORY_TRACT | Status: DC | PRN
Start: 2022-09-26 — End: 2022-09-26

## 2022-09-26 MED ORDER — PROCHLORPERAZINE EDISYLATE 10 MG/2 ML (5 MG/ML) INJECTION SOLUTION
5.0000 mg | Freq: Once | INTRAMUSCULAR | Status: DC | PRN
Start: 2022-09-26 — End: 2022-09-26

## 2022-09-26 MED ORDER — FENTANYL (PF) 50 MCG/ML INJECTION WRAPPER
25.0000 ug | INJECTION | INTRAMUSCULAR | Status: DC | PRN
Start: 2022-09-26 — End: 2022-09-26

## 2022-09-26 MED ORDER — ONDANSETRON HCL (PF) 4 MG/2 ML INJECTION SOLUTION
4.0000 mg | Freq: Three times a day (TID) | INTRAMUSCULAR | Status: DC | PRN
Start: 2022-09-26 — End: 2022-09-26

## 2022-09-26 MED ORDER — ONDANSETRON HCL (PF) 4 MG/2 ML INJECTION SOLUTION
INTRAMUSCULAR | Status: AC
Start: 2022-09-26 — End: 2022-09-26
  Filled 2022-09-26: qty 2

## 2022-09-26 MED ORDER — FAMOTIDINE (PF) 20 MG/2 ML INTRAVENOUS SOLUTION
20.0000 mg | Freq: Once | INTRAVENOUS | Status: AC
Start: 2022-09-26 — End: 2022-09-26
  Administered 2022-09-26: 20 mg via INTRAVENOUS

## 2022-09-26 MED ORDER — EPHEDRINE SULFATE 50 MG/ML INTRAVENOUS SOLUTION
Freq: Once | INTRAVENOUS | Status: DC | PRN
Start: 2022-09-26 — End: 2022-09-26
  Administered 2022-09-26: 10 mg via INTRAVENOUS

## 2022-09-26 MED ORDER — DEXAMETHASONE SODIUM PHOSPHATE 4 MG/ML INJECTION SOLUTION
4.0000 mg | Freq: Once | INTRAMUSCULAR | Status: AC
Start: 2022-09-26 — End: 2022-09-26
  Administered 2022-09-26: 4 mg via INTRAVENOUS

## 2022-09-26 MED ORDER — ONDANSETRON HCL (PF) 4 MG/2 ML INJECTION SOLUTION
Freq: Once | INTRAMUSCULAR | Status: DC | PRN
Start: 2022-09-26 — End: 2022-09-26
  Administered 2022-09-26: 4 mg via INTRAVENOUS

## 2022-09-26 MED ORDER — ONDANSETRON HCL (PF) 4 MG/2 ML INJECTION SOLUTION
4.0000 mg | Freq: Once | INTRAMUSCULAR | Status: DC | PRN
Start: 2022-09-26 — End: 2022-09-26

## 2022-09-26 SURGICAL SUPPLY — 44 items
ADAPTER CATH 9FR SLFSL FEMALE LL CKFL STRL DISP (UROLOGICAL SUPPLIES) ×1 IMPLANT
BAG DRAIN STRL SKTRN CSTL (UROLOGICAL SUPPLIES) ×1 IMPLANT
BASKET STONE RTRVL 120CM 12MM 1.9FR 0TP URET NITINOL 4WR FLAT DIST SURF KNT TPLS STRL LF  DISP (UROLOGICAL SUPPLIES) ×1 IMPLANT
CATH URET SOF-FLX 5FR 70CM OP-EN ACPT .04IN GW (UROLOGICAL SUPPLIES) ×1 IMPLANT
CLEANER INSTR PREPZYME MUL-TRD CONTAINR NARSL NEUT PH BDGR 22OZ (MISCELLANEOUS PT CARE ITEMS) ×1
CONV USE ITEM 321837 - GLOVE SURG 7.5 LTX PF NONST CRM (GLOVES AND ACCESSORIES) IMPLANT
CONV USE ITEM 321863 - GLOVE SURG 6.5 LF PF SMOOTH STRL GRN  PLISPRN MICRO (GLOVES AND ACCESSORIES) ×2 IMPLANT
CONV USE ITEM 321982 - GLOVE SURG 7 LTX CHEMO PF SMOOTH BEAD CUF STRL WHT 11.6IN PLMR THK.2MM THK.21MM (GLOVES AND ACCESSORIES) IMPLANT
CONV USE ITEM 329146 - CLEANER INSTR PREPZYME MUL-TRD CONTAINR NARSL NEUT PH BDGR 22OZ (MISCELLANEOUS PT CARE ITEMS) ×1 IMPLANT
CONV USE ITEM 339060 - PACK SURG CSCP STRL TBRN LF (CUSTOM TRAYS & PACK) ×1 IMPLANT
FIBER LASER DORNIER SINGLEFLEX 270UM HO FLAT TIP DISP (SURGICAL LASER SUPPLIES) ×1 IMPLANT
GLOVE SURG 6 LF  PF BEAD CUF STRL CRM 11.3IN PROTEXIS PLISPRN THK9.1 MIL (GLOVES AND ACCESSORIES) IMPLANT
GLOVE SURG 6 LF  PF SMOOTH BEAD CUF STRL GRN 12IN SENSICARE PI PLISPRN PLMR ALOE THK7.9 MIL DISP (GLOVES AND ACCESSORIES) IMPLANT
GLOVE SURG 6.5 LF  PF BEAD CUF STRL CRM 11.3IN PROTEXIS PI PLISPRN THK9.1 MIL (GLOVES AND ACCESSORIES) ×2 IMPLANT
GLOVE SURG 6.5 LF  PF SMOOTH BEAD CUF INTLK STRL BLU 11.3IN PROTEXIS NEU-THERA PLISPRN THK7.9 MIL (GLOVES AND ACCESSORIES) ×2 IMPLANT
GLOVE SURG 6.5 LTX PF BEAD CUF MICRO ROUGHEN N-PYRG STRL STRW BGL SRG CURVE FINGER (GLOVES AND ACCESSORIES) IMPLANT
GLOVE SURG 7 LF  PF BEAD CUF STRL CRM 11.8IN PROTEXIS PI PLISPRN THK9.1 MIL (GLOVES AND ACCESSORIES) IMPLANT
GLOVE SURG 7 LF  PF SMOOTH TXTR BEAD CUF STRL GRN 12IN SENSICARE PI PLISPRN SYN PLMR ALOE THK7.9 MIL (GLOVES AND ACCESSORIES) IMPLANT
GLOVE SURG 7 LTX CHEMO PF SMOOTH BEAD CUF STRL WHT 11.6IN PLMR THK.2MM THK.21MM (GLOVES AND ACCESSORIES)
GLOVE SURG 7.5 LF  PF BEAD CUF STRL CRM 11.8IN PROTEXIS PI PLISPRN THK9.1 MIL (GLOVES AND ACCESSORIES) ×1 IMPLANT
GLOVE SURG 7.5 LF  PF SMOOTH BEAD CUF STRL GRN 12IN SENSICARE PI GRN PLISPRN PLMR ALOE THK7.9 MIL (GLOVES AND ACCESSORIES) IMPLANT
GLOVE SURG 7.5 LTX PF NONST CRM (GLOVES AND ACCESSORIES)
GLOVE SURG 8 LF  PF BEAD CUF SMOOTH TXTR STRL GRN 12IN SENSICARE PLISPRN SYN PLMR ALOE THK7.9 MIL (GLOVES AND ACCESSORIES) IMPLANT
GLOVE SURG 8 LF  PF BEAD CUF STRL CRM 11.8IN PROTEXIS PI PLISPRN THK9.1 MIL (GLOVES AND ACCESSORIES) IMPLANT
GLOVE SURG 8.5 LF  PF BEAD CUF STRL CRM 11.8IN PROTEXIS PI PLISPRN THK9.1 MIL (GLOVES AND ACCESSORIES) IMPLANT
GOWN SURG 2XL XLNG LGTH L3 HKLP CLSR RGLN SLEEVE TWL STRL LF  DISP GRN AERO BLU PRFRM FBRC (DRAPE/PACKS/SHEETS/OR TOWEL) ×1 IMPLANT
GOWN SURG LRG STD LGTH REG L3 NONREINFORCE BRTHBL TWL STRL LF  DISP BLU HALYARD SPECTRUM SMS (DRAPE/PACKS/SHEETS/OR TOWEL) ×5 IMPLANT
GW URO .035IN 150CM 3CM SENSOR STR FLX TP RADOPQ NITINOL SS HDRPH PTFE URET LF (UROLOGICAL SUPPLIES) IMPLANT
GW URO .035IN 150CM 3CM ZIPWR STR RADOPQ STD SHAFT NITINOL HDRPH URET LF (UROLOGICAL SUPPLIES) ×1 IMPLANT
MAT FLR 110X29IN STD ABSRBENT WATERPROF BACKSHEET RL (MED SURG SUPPLIES) ×1 IMPLANT
PACK SURG CSCP STRL TBRN LF (CUSTOM TRAYS & PACK) ×1
SET .241IN 96IN 2 NONVENT PIERCE PIN 2 PINCH CLAMP SIGHT CHAMBER Y CONN IRRG 85ML TUR IV STRL LF (IV TUBING & ACCESSORIES) ×1 IMPLANT
SET TURP 654301 CS/20 (IV TUBING & ACCESSORIES) ×1
SHEATH ACCESS 12FR DILATION CONT WRK CHNL SM FLXB SCP LFRIC SURF FLEXOR URET HDRPH 35CM STRL LF (UROLOGICAL SUPPLIES) ×1 IMPLANT
SOL IRRG 0.9% NACL 2000ML PRSV FR N-PYRG FLXB CONTAINR STRL (MEDICATIONS/SOLUTIONS) ×1
SOL IRRG 0.9% NACL 2000ML PRSV FR N-PYRG FLXB CONTAINR STRL LF (MEDICATIONS/SOLUTIONS) ×1 IMPLANT
SOL IV 0.9% NACL 1000ML STRL PRSV FR FLXB CONTAINR LF (MEDICATIONS/SOLUTIONS) ×1 IMPLANT
SPONGE GAUZE 4X4IN MDCHC COTTON 12 PLY TY 7 LF  STRL DISP (WOUND CARE SUPPLY) ×1 IMPLANT
STENT CONTOUR 6FR 22CM URET TAPER TIP BLADDER MRK LOW PROF BRD SUT PRCFLX HDR+ PGTL CURVE LF (STENTS UROLOGICAL) ×1 IMPLANT
SYRINGE LL 10ML LF  STRL GRAD N-PYRG DEHP-FR PVC FREE MED DISP (MED SURG SUPPLIES) ×1 IMPLANT
SYRINGE LL 20ML STRL GRAD MED DISP (MED SURG SUPPLIES) ×1 IMPLANT
TOWEL 24X16IN COTTON BLU DISP SURG STRL LF (DRAPE/PACKS/SHEETS/OR TOWEL) ×1 IMPLANT
URTSCP FLXB 9.5FR 7.7FR 3.6FR LITHOVUE STD INTGR CAM HEAD MBL CART LASER LTHTRP DISP (UROLOGICAL SUPPLIES) ×1 IMPLANT
WATER STRL 1000ML PLASTIC PR BTL LF (MED SURG SUPPLIES) ×1 IMPLANT

## 2022-09-26 NOTE — Anesthesia Postprocedure Evaluation (Signed)
Anesthesia Post Op Evaluation    Patient: Leslie Hall  Procedure(s):  CYSTOSCOPY; LASER LITHOTRIPSY; RIGHT URETEROSCOPY; RIGHT RETROGRADE PYELOGRAM; RIGHT STENT PLACEMENT    Last Vitals:Temperature: 36.2 C (97.2 F) (09/26/22 1025)  Heart Rate: 88 (09/26/22 1025)  BP (Non-Invasive): (!) 148/86 (09/26/22 1025)  Respiratory Rate: 14 (09/26/22 1025)  SpO2: 97 % (09/26/22 1025)    No notable events documented.    Patient is sufficiently recovered from the effects of anesthesia to participate in the evaluation and has returned to their pre-procedure level.  Patient location during evaluation: PACU       Patient participation: complete - patient participated  Level of consciousness: awake and alert and responsive to verbal stimuli    Pain management: adequate  Airway patency: patent    Anesthetic complications: no  Cardiovascular status: acceptable  Respiratory status: acceptable  Hydration status: acceptable  Patient post-procedure temperature: Pt Normothermic   PONV Status: Absent

## 2022-09-26 NOTE — H&P (Signed)
[facility]  Urology Admission History and Physical      Leslie Hall, Leslie Hall, 76 y.o. female  Encounter Start Date:  09/26/2022  Inpatient Admission Date:    Date of Birth:  [birth year]    PCP: Leslie Batty, MD    Information Obtained from: patient  Chief Complaint: ureterolithiasis, nephrolithiasis       HPI:    Leslie Hall is a pleasant 76 year old female who recently experienced an episode of urosepsis and obstructive uropathy secondary to a right distal ureteral calculus requiring transfer to [facility] with a nephrostomy tube placed on July 29, 2022.  She presents today for follow-up.  This was her 1st episode of a kidney stone.  Her CT also demonstrated bilateral nonobstructing kidney stones. She denies fevers, chills, nausea, vomiting, hematuria, dysuria, flank pain, incontinence, dribbling, hesitancy, suprapubic pain, headaches, vision changes, shortness of breath, chest pain.  She underwent right ureteroscopy right nephrostomy tube removal and left pyeloscopy on September 12, 2022.  She presents today for right pyeloscopy for her right nonobstructing stones.       ROS:  MUST comment on all "Abnormal" findings   ROS Other than ROS in the HPI, all other systems were negative.      PAST MEDICAL/ FAMILY/ SOCIAL HISTORY:       Past Medical History:   Diagnosis Date    Hypertension     Kidney stones     Mixed hyperlipidemia     Pneumonia     Skin cancer     forehead         Allergies   Allergen Reactions    Morphine  Other Adverse Reaction (Add comment)     BP drop shaking     Latex Rash    Amitriptyline  Other Adverse Reaction (Add comment)     Heart issues    Ciprofloxacin  Other Adverse Reaction (Add comment)     Unsure      Mobic [Meloxicam]  Other Adverse Reaction (Add comment)     Gi isssues    Niacin  Other Adverse Reaction (Add comment)     Burning ears      Penicillins  Other Adverse Reaction (Add comment)     Yeast infections    Tylenol [Acetaminophen]  Other Adverse Reaction (Add comment)      Gi issues    Cefdinir Itching     Medications Prior to Admission       Prescriptions    amLODIPine (NORVASC) 5 mg Oral Tablet    Take 1 Tablet (5 mg total) by mouth Once a day    aspirin (ECOTRIN) 81 mg Oral Tablet, Delayed Release (E.C.)    Take by mouth Once a day    famotidine (PEPCID) 40 mg Oral Tablet    Take 1 Tablet (40 mg total) by mouth Every night    Patient not taking:  Reported on 09/12/2022    montelukast (SINGULAIR) 10 mg Oral Tablet    Take 1 Tablet (10 mg total) by mouth Once a day    omeprazole (PRILOSEC) 40 mg Oral Capsule, Delayed Release(E.C.)    Take 1 Capsule (40 mg total) by mouth Once a day    Patient not taking:  Reported on 09/12/2022    tamsulosin (FLOMAX) 0.4 mg Oral Capsule    TAKE 1 CAPSULE BY MOUTH ONCE DAILY AT BEDTIME    vit C/E/Zn/coppr/lutein/zeaxan (PRESERVISION AREDS-2 ORAL)    Take 1 Tablet by mouth Twice daily  Gentamicin IV - Pharmacist to Dose per Protocol, , Does not apply, Daily PRN  LR premix infusion, , Intravenous, Continuous  NS flush syringe, 3 mL, Intracatheter, Q8HRS  NS flush syringe, 3 mL, Intracatheter, Q1H PRN      Past Surgical History:   Procedure Laterality Date    HEEL SPUR SURGERY      HX BREAST BIOPSY Bilateral     yrs ago -    HX HYSTERECTOMY      HX TRIGGER FINGER RELEASE      NEPHROSTOMY      SKIN CANCER EXCISION      forehead    URETERAL STENT PLACEMENT           Social History     Tobacco Use    Smoking status: Never    Smokeless tobacco: Never   Vaping Use    Vaping Use: Never used   Substance Use Topics    Alcohol use: Never    Drug use: Never       Gen: NAD, alert  Pulm: unlabored at rest  CV: palpable pulses  Abd: soft, Nt/ND  GU: no suprapubic tenderness, no CVAT      Assessment & Plan:   There are no active hospital problems to display for this patient.    Bilateral non-obstructing nephrolithiasis, 30m right, 742mleft; s/p left URS on 09/12/2022  I discussed the differential diagnosis, pathophysiology and nature of asymptomatic  non-obstructing renal stones as well as the natural history  Based on the current best-available data [J Urol. 2015 Apr;193(4):1265-9] with an average follow-up of 3.5 years:  Approximately 60% of patients will remain asymptomatic  Less than 30% will experience renal colic  Less than 2007%ill require operative intervention for pain  Spontaneous stone passage will occur in 7% of cases  Importantly, patient was counseled on risk of silent obstruction causing hydronephrosis and requiring intervention    Patient was counseled on the need for possible future treatment for urinary calculi including observation, extracorporeal shockwave lithotripsy (ESWL), ureteroscopic extraction/intracorporeal laser lithotripsy, percutaneous nephrolithotomy (PCNL), or open ureterolithotomy/nephrolithotomy. The risks, advantages, and disadvantages of each were discussed.   I reviewed the recent 2014 American Urological Association guideline on "Medical Management of Kidney Stones" and specifically discussed dietary therapies including:  Increase fluid intake to achieve urine output of at least 2.5 liters daily  Limit sodium intake to no more than 100 mEq (2,300 mg) per day  Consume 1,000-1,200 mg of dietary calcium per day  Limit oxalate-rich foods (beets, spinach, rhubarb, nuts, chocolate)  Limit non-dairy animal protein  Encouraged incresed fruit and vegetable intake  Patient was provided with written stone prevention recommendations.  Patient was also counseled on the benefits of further serum and 24 hour urine testing to identify the metabolic disorders responsible for recurrent stone disease        Right distal ureterolithiasis, 21m67mstatus post right nephrostomy tube placement on 07/29/2022 due to septic shock 2/2 urosepsis s/p right URS and stent removal on 09/12/2022  I discussed the differential diagnosis, pathophysiology and nature of urolithiasis and recurrent stone disease.  Patient was counseled on available treatment options  in the management of their 5 mm calculus located within the right distal ureter  Conservative, medical expulsive therapy consisting of alpha-blocker therapy, analgesia and directed fluids which is generally reserved for ureteral stones <5 mm without complicating factors (infection, intractable pain/nausea)  The patient was counseled regarding treatment options for urinary calculi including observation, extracorporeal shockwave lithotripsy (ESWL), ureteroscopic  extraction/intracorporeal laser lithotripsy, percutaneous nephrolithotomy (PCNL), or open ureterolithotomy/nephrolithotomy. The risks, advantages, and disadvantages of each were discussed.   Patient understands that the risks include but are not limited to bleeding, infection, damage to adjacent tissues, renal fracture/contusion/hemorrhage, anesthesia risks, loss of the kidney, steinstrasse, therapeutic failure, possible need for post-operative drains/stents, need for additional procedures.  After further discussion, patient has elected to undergo cystoscopy, ureteroscopy with laser lithotripsy, stone manipulation and ureteral stent insertion at the next available date  Plan for left stone special at the same time  Two weeks later we will plan for right stone special to remove right kidney stone         Landis Gandy, DO

## 2022-09-26 NOTE — Anesthesia Preprocedure Evaluation (Signed)
ANESTHESIA PRE-OP EVALUATION  Planned Procedure: CYSTOSCOPY; LASER LITHOTRIPSY; RIGHT URETEROSCOPY; RIGHT RETROGRADE PYELOGRAM; RIGHT STENT PLACEMENT (Right: Ureter)  Review of Systems     anesthesia history negative     patient summary reviewed  nursing notes reviewed        Pulmonary   pneumonia,   Cardiovascular    Hypertension, well controlled, ECG reviewed and hyperlipidemia ,No peripheral edema,  Exercise Tolerance: > or = 4 METS        GI/Hepatic/Renal    kidney stones        Endo/Other   neg endo/other ROS,       Neuro/Psych/MS   negative neuro/psych ROS,      Cancer    negative hematology/oncology ROS,               Physical Assessment      Airway       Mallampati: II    TM distance: 3 FB    Neck ROM: full  Mouth Opening: good.            Dental       Dentition intact             Pulmonary    Breath sounds clear to auscultation  (-) no rhonchi, no decreased breath sounds, no wheezes, no rales and no stridor     Cardiovascular    Rhythm: regular  Rate: Normal  (-) no friction rub, carotid bruit is not present, no peripheral edema and no murmur     Other findings          Plan  ASA 2     Planned anesthesia type: general     general anesthesia with laryngeal mask airway                PONV/POV Plan:  I plan to administer pharmcologic prophalaxis antiemetics      Anesthesia issues/risks discussed are: Dental Injuries, Stroke, Aspiration, Cardiac Events/MI, Intraoperative Awareness/ Recall and Sore Throat.  Anesthetic plan and risks discussed with patient  signed consent obtained            Patient's NPO status is appropriate for Anesthesia.           Plan discussed with CRNA.

## 2022-09-26 NOTE — OR Surgeon (Signed)
[facility]                                              OPERATIVE NOTE    Patient Name: Leslie, Hall Number: [id number]  Date of Service: 09/26/2022   Date of Birth: 1946-08-15    All elements must be documented.    Pre-Operative Diagnosis:right nephrolithiasis   Post-Operative Diagnosis:same  Procedure(s)/Description:  Cystoscopy, left ureteral stent removal, right ureteral stent removal, right retrograde pyelogram, right ureteroscopy, right pyeloscopy laser lithotripsy stone manipulation, right ureteral stent insertion  Findings:  2 5 mm right upper pole kidney stones     Patient prepped and draped in sterile fashion.  A well lubricated cystoscope was inserted into the urethra.  Upon entering the bladder the bilateral stents were immediately encountered.  The left stent was grasped with a flexible grasper and removed in its entirety as was the right.  Following this a gentle right retrograde pyelogram was carried out which did not demonstrate any hydronephrosis or filling defects.  Two wires, a sensor and a zip, were placed into the right ureter under fluoroscopic guidance.  Again under fluoroscopic guidance over the sensor wire and access sheath was placed without difficulty.  Through this a flexible ureteroscope was inserted and upon entering the renal pelvis systematic evaluation demonstrated to upper pole kidney stones measuring roughly 5 mm each.  Using a holmium laser these were fragmented into manageable pieces.  Unfortunately following this visualization was obscured due to bleeding and I was unable to clearly identify any remaining fragments however during the initial lithotripsy it did appear that the stones were fragmented to a very manageable size allowing for spontaneous passage should she require it.  After re-evaluating the remaining calices no further stones were identified.  The access sheath was removed under direct endoscopic vision with the end of the  ureteroscope just distal to the tip of the access sheath.  There was no access sheath injuries noted however due to the general traumatic nature of an access sheath a decision was made to replace the ureteral stent which was done so in standard Seldinger fashion with satisfactory proximal distal curl noted on fluoroscopy.  The patient's bladder was emptied and the stent string was affixed to her pubic region.    Attending Surgeon: Landis Gandy  Assistant(s): NA    Anesthesia Type: General  Estimated Blood Loss:  Minimal  Blood Given: NA  Fluids Given: NS  Complications (unintended/unexpected/iatrogenic/accidental/inadvertent events):  NA  Characteristic Event (routinely expected or inherent to the difficulty/nature of the procedure): NA  Did the use of current and/or prior Anticoagulants impact the outcome of the case? No  Wound Class: Clean Contaminated Wounds -Respiratory, GI, Genital, or Urinary    Tubes:  right ureteral stent 6Frx22cm  Drains: None  Specimens/ Cultures: none  Implants: NA           Disposition: PACU - hemodynamically stable.  Condition: stable    Plan:  Remove stent at home in one week  Follow up with me in 4-8 weeks       Landis Gandy, DO

## 2022-11-09 ENCOUNTER — Encounter (INDEPENDENT_AMBULATORY_CARE_PROVIDER_SITE_OTHER): Payer: Medicare HMO | Admitting: Student in an Organized Health Care Education/Training Program

## 2022-12-13 ENCOUNTER — Ambulatory Visit (INDEPENDENT_AMBULATORY_CARE_PROVIDER_SITE_OTHER): Payer: Medicare HMO | Admitting: Student in an Organized Health Care Education/Training Program

## 2022-12-13 ENCOUNTER — Ambulatory Visit (INDEPENDENT_AMBULATORY_CARE_PROVIDER_SITE_OTHER): Payer: Medicare HMO

## 2022-12-13 ENCOUNTER — Other Ambulatory Visit
Payer: Medicare HMO | Attending: Student in an Organized Health Care Education/Training Program | Admitting: Student in an Organized Health Care Education/Training Program

## 2022-12-13 ENCOUNTER — Other Ambulatory Visit: Payer: Self-pay

## 2022-12-13 ENCOUNTER — Encounter (INDEPENDENT_AMBULATORY_CARE_PROVIDER_SITE_OTHER): Payer: Self-pay | Admitting: Student in an Organized Health Care Education/Training Program

## 2022-12-13 VITALS — BP 150/72 | HR 91 | Ht 67.0 in | Wt 140.0 lb

## 2022-12-13 DIAGNOSIS — Z9889 Other specified postprocedural states: Secondary | ICD-10-CM

## 2022-12-13 DIAGNOSIS — N1330 Unspecified hydronephrosis: Secondary | ICD-10-CM

## 2022-12-13 DIAGNOSIS — N202 Calculus of kidney with calculus of ureter: Secondary | ICD-10-CM

## 2022-12-13 DIAGNOSIS — N200 Calculus of kidney: Secondary | ICD-10-CM | POA: Insufficient documentation

## 2022-12-13 DIAGNOSIS — N201 Calculus of ureter: Secondary | ICD-10-CM

## 2022-12-13 LAB — PARATHYROID HORMONE (PTH): PTH: 55.4 pg/mL (ref 12.0–88.0)

## 2022-12-13 NOTE — Progress Notes (Signed)
[place]    Progress Note    Name: Leslie Hall MRN:  [id number]   Date: 12/13/2022 Age: 77 y.o.       Chief Complaint: Post Op (Post op f/u from stent removal)    Subjective:   Leslie Hall is a pleasant 77 year old female who recently experienced an episode of urosepsis and obstructive uropathy secondary to a right distal ureteral calculus requiring transfer to [facility] with a nephrostomy tube placed on July 29, 2022.  She presents today for follow-up.  This was her 1st episode of a kidney stone.  Her CT also demonstrated bilateral nonobstructing kidney stones. She denies fevers, chills, nausea, vomiting, hematuria, dysuria, flank pain, incontinence, dribbling, hesitancy, suprapubic pain, headaches, vision changes, shortness of breath, chest pain.  She underwent right ureteroscopy right nephrostomy tube removal and left pyeloscopy on September 12, 2022.     She recently underwent right pyeloscopy on 09/26/2022 for right non-obstructing nephrolithiasis. She denies complaints. Her stone composition was CaP. Her serum calcium is normal-high.       Objective :  BP (!) 150/72 (Site: Right, Patient Position: Sitting, Cuff Size: Adult)   Pulse 91   Ht 1.702 m ('5\' 7"'$ )   Wt 63.5 kg (140 lb)   BMI 21.93 kg/m       Gen: NAD, alert  Pulm: unlabored at rest  CV: palpable pulses  Abd: soft, Nt/ND  GU: no suprapubic tenderness, no CVAT        Data reviewed:    Current Outpatient Medications   Medication Sig    amLODIPine (NORVASC) 5 mg Oral Tablet Take 1 Tablet (5 mg total) by mouth Once a day    aspirin (ECOTRIN) 81 mg Oral Tablet, Delayed Release (E.C.) Take by mouth Once a day    famotidine (PEPCID) 40 mg Oral Tablet Take 1 Tablet (40 mg total) by mouth Every night (Patient not taking: Reported on 09/12/2022)    montelukast (SINGULAIR) 10 mg Oral Tablet Take 1 Tablet (10 mg total) by mouth Once a day    omeprazole (PRILOSEC) 40 mg Oral Capsule, Delayed Release(E.C.) Take 1 Capsule  (40 mg total) by mouth Once a day (Patient not taking: Reported on 09/12/2022)    vit C/E/Zn/coppr/lutein/zeaxan (PRESERVISION AREDS-2 ORAL) Take 1 Tablet by mouth Twice daily     Assessment/Plan  Problem List Items Addressed This Visit    None        Bilateral non-obstructing nephrolithiasis, 6m right, 738mleft; s/p left URS on 09/12/2022 & right URS on 09/26/2022     Right distal ureterolithiasis, 35m70mstatus post right nephrostomy tube placement on 07/29/2022 due to septic shock 2/2 urosepsis s/p right URS and stent removal on 09/12/2022      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  We also reviewed the recent 2014 American Urological Association guideline on "Medical Management of Kidney Stones" and specifically discussed dietary therapies including:  Increase fluid intake to achieve urine output of at least 2.5 liters daily  Limit sodium intake to no more than 100 mEq (2,300 mg) per day  Consume 1,000-1,200 mg of dietary calcium per day  Limit oxalate-rich foods (beets, spinach, rhubarb, nuts, chocolate)  Limit non-dairy animal protein  Encouraged incresed fruit and vegetable intake        JosLandis GandyO       A combined total of  45 minutes were spent preparing to see the patient, reviewing previous records, ordering tests/medications/procedures, documenting the clinical encounter as well as performing a medically appropriate evaluation and independently interpreting results and communicating them to the patient/family/caregiver as specifically outlined above in the impression and plan.    This note may have been fully or partially generated using MModal Fluency Direct system, and there may be some incorrect words, spellings, and punctuation that were not identified in checking the note before saving.

## 2023-01-22 ENCOUNTER — Other Ambulatory Visit (HOSPITAL_COMMUNITY): Payer: Self-pay | Admitting: PHYSICIAN/UNDEFINED PHYSICIAN TYPE

## 2023-01-22 DIAGNOSIS — M7989 Other specified soft tissue disorders: Secondary | ICD-10-CM

## 2023-01-22 IMAGING — US DUPLEX VENOUS US UNILATERAL
1 series · 14 of 24 positions shown · non-contrast
Comparison: Right knee x-ray dated 07/15/2020.

﻿EXAM:  [id number]   DUPLEX VENOUS US UNILATERAL, RIGHT SIDE:
INDICATION: Right lower extremity pain and swelling.
TECHNIQUE: High-resolution ultrasound with compression test and Doppler analysis including augmentation.

[Series 1: duplex venous us unilateral · portal-venous · 14 of 63 slices shown]
[im 1/63]
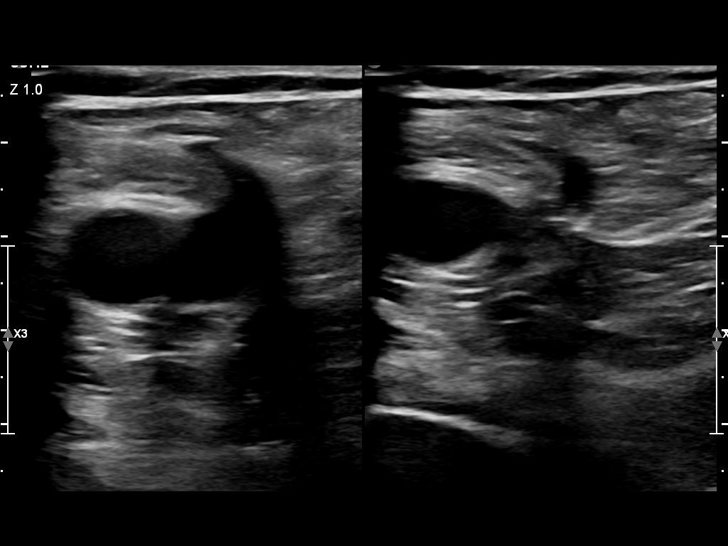
[im 6/63]
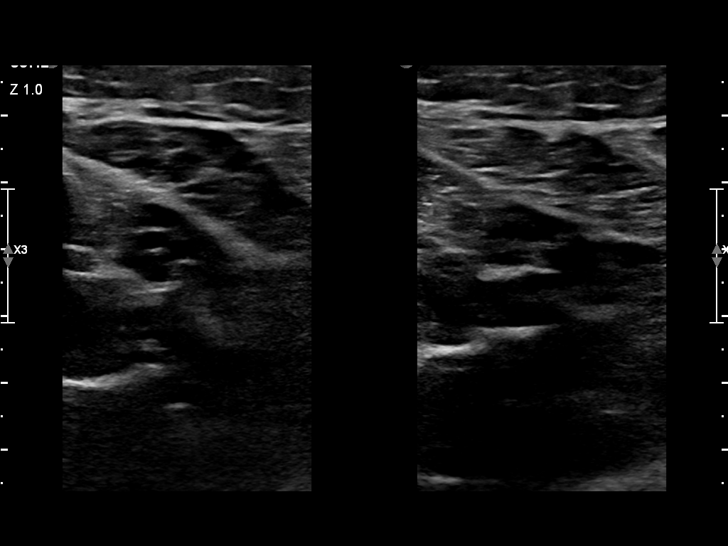
[im 11/63]
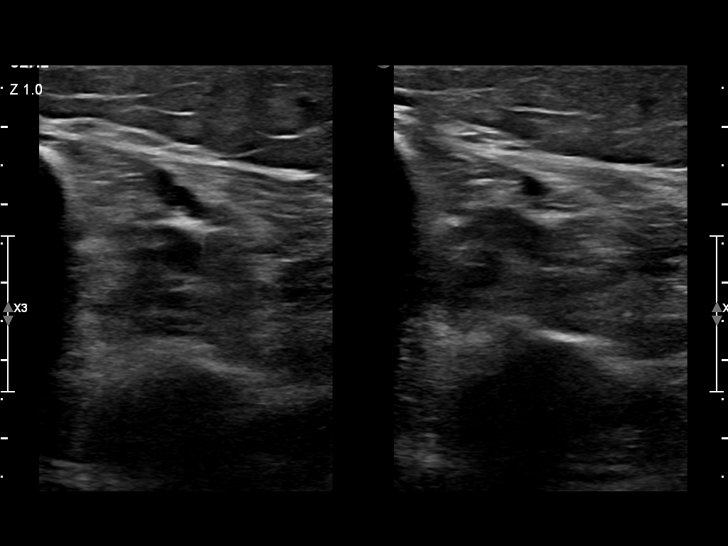
[im 17/63]
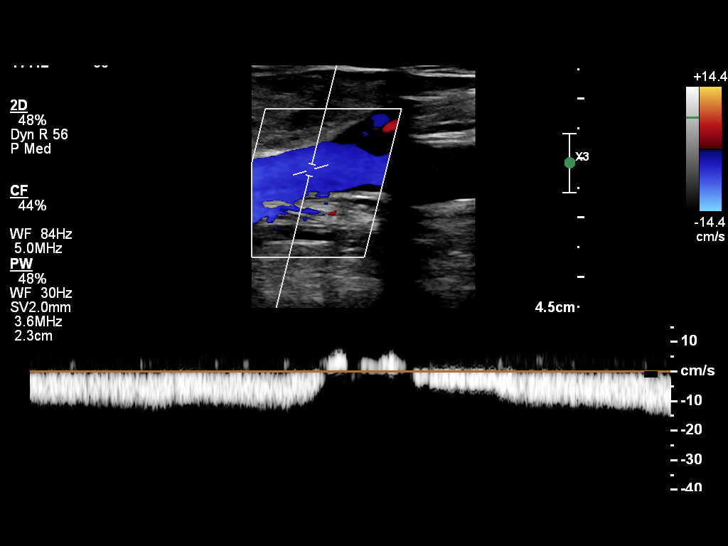
[im 19/63]
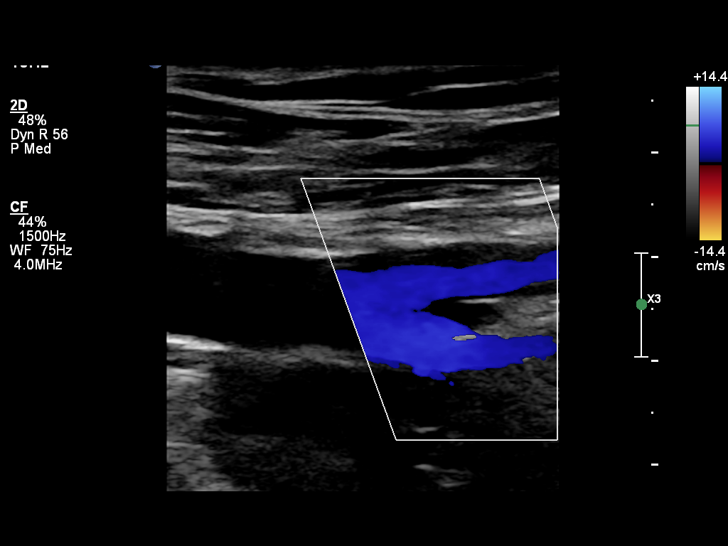
[im 25/63]
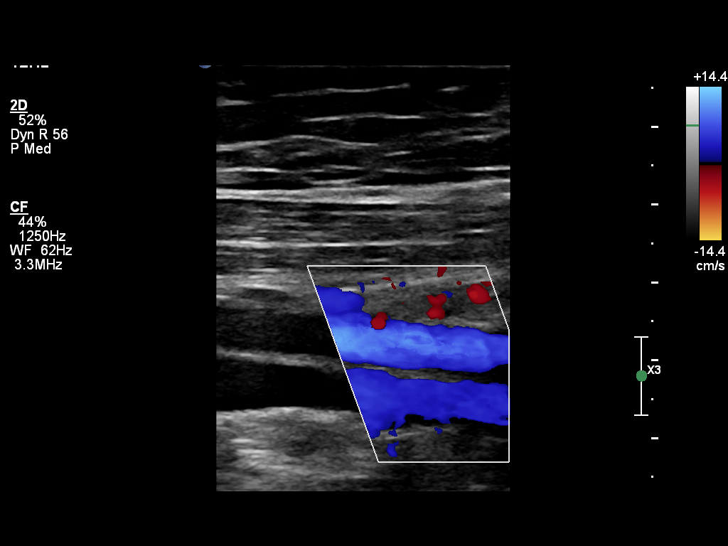
[im 30/63]
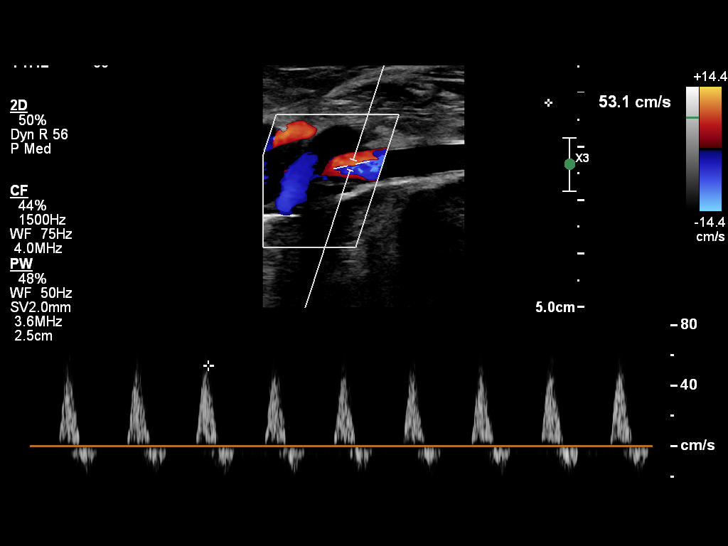
[im 33/63]
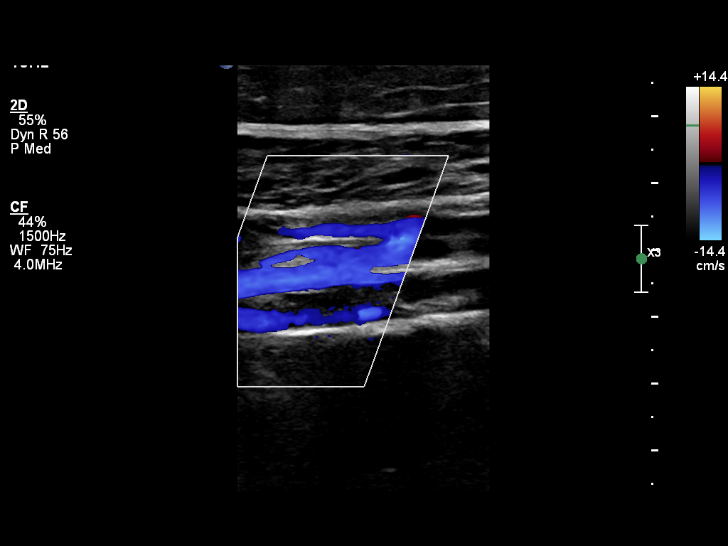
[im 38/63]
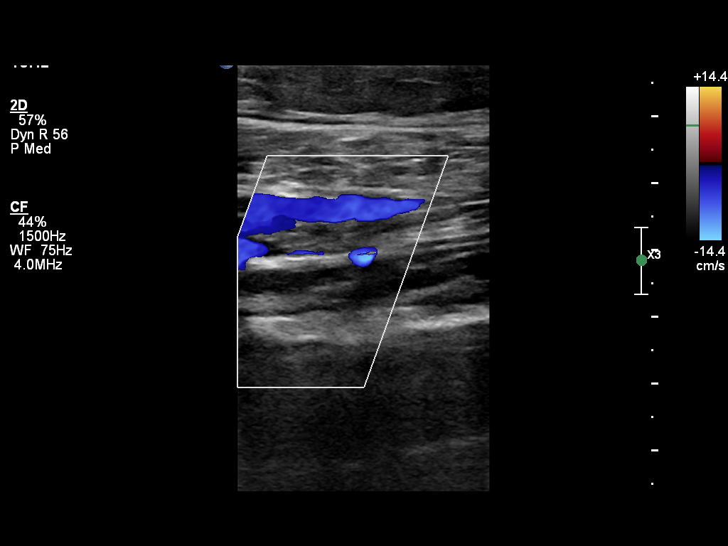
[im 44/63]
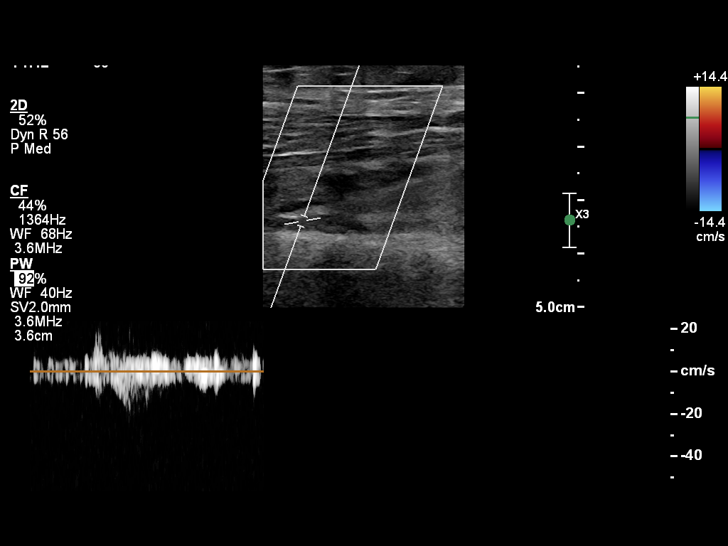
[im 49/63]
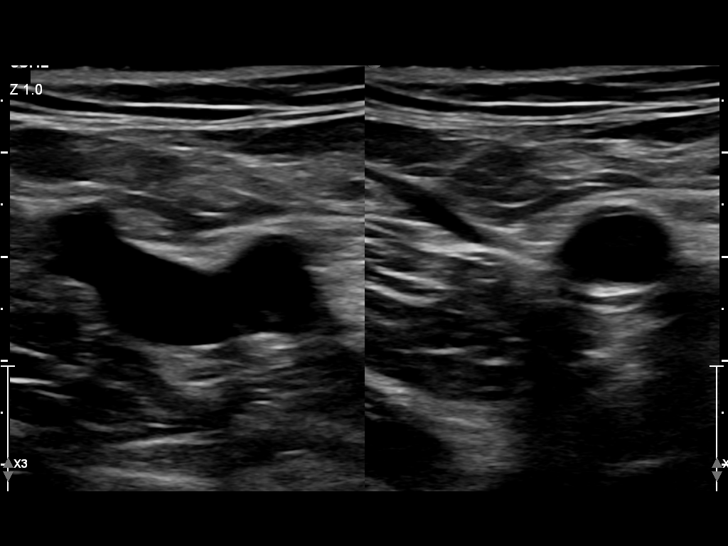
[im 52/63]
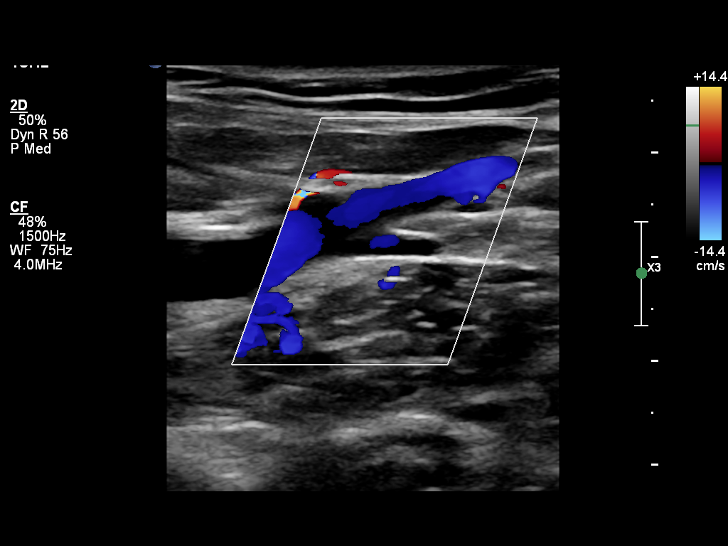
[im 57/63]
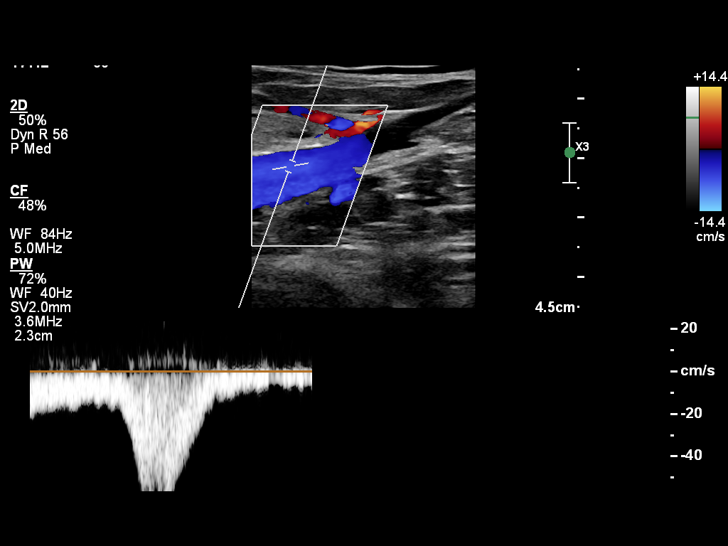
[im 63/63]
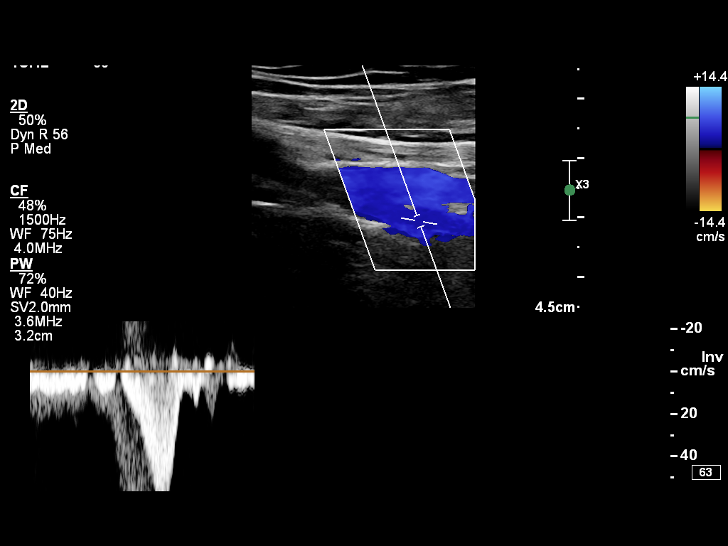

[14 of 24 positions shown; findings below may reference images not displayed]

FINDINGS: No evidence of DVT is noted in the right common femoral, femoral and popliteal veins and major veins of the right calf.  Comparison images of left common femoral vein were free of thrombosis.
IMPRESSION: No evidence of DVT involving major veins of the right lower extremity.  If clinical suspicion of DVT persists, repeat study may be obtained after 1 week.

Electronically Signed by ALEXANDRE, BENEVALDO at [id number] [DATE]

## 2023-01-29 ENCOUNTER — Ambulatory Visit (HOSPITAL_COMMUNITY): Payer: Self-pay
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
## 2023-06-05 ENCOUNTER — Other Ambulatory Visit: Payer: Medicare HMO | Attending: PHYSICIAN/UNDEFINED PHYSICIAN TYPE

## 2023-06-05 ENCOUNTER — Other Ambulatory Visit: Payer: Self-pay

## 2023-06-05 DIAGNOSIS — E785 Hyperlipidemia, unspecified: Secondary | ICD-10-CM

## 2023-06-05 DIAGNOSIS — I10 Essential (primary) hypertension: Secondary | ICD-10-CM

## 2023-06-05 LAB — LIPID PANEL
CHOL/HDL RATIO: 6.8
CHOLESTEROL: 346 mg/dL — ABNORMAL HIGH (ref <200)
HDL CHOL: 51 mg/dL (ref >=40)
LDL CALC: 254 mg/dL — ABNORMAL HIGH (ref 0–100)
TRIGLYCERIDES: 204 mg/dL — ABNORMAL HIGH (ref <=150)
VLDL CALC: 41 mg/dL (ref 0–50)

## 2023-06-05 LAB — BASIC METABOLIC PANEL
ANION GAP: 6 mmol/L (ref 4–13)
BUN/CREA RATIO: 20 (ref 6–22)
BUN: 19 mg/dL (ref 7–25)
CALCIUM: 9.8 mg/dL (ref 8.6–10.3)
CHLORIDE: 111 mmol/L — ABNORMAL HIGH (ref 98–107)
CO2 TOTAL: 23 mmol/L (ref 21–31)
CREATININE: 0.97 mg/dL (ref 0.60–1.30)
ESTIMATED GFR: 61 mL/min/{1.73_m2} (ref >59)
GLUCOSE: 110 mg/dL — ABNORMAL HIGH (ref 74–109)
OSMOLALITY, CALCULATED: 282 mOsm/kg (ref 270–290)
POTASSIUM: 3.8 mmol/L (ref 3.5–5.1)
SODIUM: 140 mmol/L (ref 136–145)

## 2023-06-13 ENCOUNTER — Ambulatory Visit (INDEPENDENT_AMBULATORY_CARE_PROVIDER_SITE_OTHER): Payer: Medicare HMO | Admitting: Student in an Organized Health Care Education/Training Program

## 2023-06-13 ENCOUNTER — Other Ambulatory Visit: Payer: Self-pay

## 2023-06-13 VITALS — BP 158/88 | HR 86 | Ht 67.0 in | Wt 137.4 lb

## 2023-06-13 DIAGNOSIS — N200 Calculus of kidney: Secondary | ICD-10-CM

## 2023-06-13 NOTE — Progress Notes (Unsigned)
UROLOGY, [facility]  [address] [place] [address]    Progress Note    Name: Leslie Hall MRN:  [id number]   Date: 06/13/2023 DOB:  11/20/1945 (76 y.o.)             Chief Complaint: Follow Up 6 Months (Pt following up on bilateral non obstructing nephrolithiasis, and right ureterolithiasis ///Room 1)  Subjective   Subjective:   Leslie Hall is a pleasant 77 year old female who recently experienced an episode of urosepsis and obstructive uropathy secondary to a right distal ureteral calculus requiring transfer to [facility] with a nephrostomy tube placed on July 29, 2022.  She presents today for follow-up.  This was her 1st episode of a kidney stone.  Her CT also demonstrated bilateral nonobstructing kidney stones. She denies fevers, chills, nausea, vomiting, hematuria, dysuria, flank pain, incontinence, dribbling, hesitancy, suprapubic pain, headaches, vision changes, shortness of breath, chest pain.  She underwent right ureteroscopy right nephrostomy tube removal and left pyeloscopy on September 12, 2022.      She recently underwent right pyeloscopy on 09/26/2022 for right non-obstructing nephrolithiasis. She denies complaints. Her stone composition was CaP. Her serum calcium is normal-high. Her PTH was within normal limits. She denies fevers, chills, nausea, vomiting, hematuria, dysuria, flank pain, incontinence, dribbling, hesitancy, suprapubic pain, headaches, vision changes, shortness of breath, chest pain.           Objective   Objective :  BP (!) 158/88 (Site: Right Arm, Patient Position: Sitting, Cuff Size: Adult)   Pulse 86   Ht 1.702 m (5\' 7" )   Wt 62.3 kg (137 lb 6.4 oz)   BMI 21.52 kg/m       Gen: NAD, alert  Pulm: unlabored at rest  CV: palpable pulses  Abd: soft, Nt/ND  GU: no suprapubic tenderness, no CVAT        Data reviewed:    Current Outpatient Medications   Medication Sig    amLODIPine (NORVASC) 5 mg Oral Tablet Take 1 Tablet (5 mg total) by mouth Once a day     aspirin (ECOTRIN) 81 mg Oral Tablet, Delayed Release (E.C.) Take by mouth Once a day    famotidine (PEPCID) 40 mg Oral Tablet Take 1 Tablet (40 mg total) by mouth Every night (Patient not taking: Reported on 09/12/2022)    montelukast (SINGULAIR) 10 mg Oral Tablet Take 1 Tablet (10 mg total) by mouth Once a day    omeprazole (PRILOSEC) 40 mg Oral Capsule, Delayed Release(E.C.) Take 1 Capsule (40 mg total) by mouth Once a day (Patient not taking: Reported on 09/12/2022)    vit C/E/Zn/coppr/lutein/zeaxan (PRESERVISION AREDS-2 ORAL) Take 1 Tablet by mouth Twice daily        Assessment/Plan  Problem List Items Addressed This Visit    None  Visit Diagnoses       Nephrolithiasis    -  Primary    Relevant Orders    XR KUB          Bilateral non-obstructing nephrolithiasis, 6mm right, 7mm left; s/p left URS on 09/12/2022 & right URS on 09/26/2022   no further workup at this time      Right distal ureterolithiasis, 5mm, status post right nephrostomy tube placement on 07/29/2022 due to septic shock 2/2 urosepsis s/p right URS and stent removal on 09/12/2022    no further workup at this time       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  We also reviewed the recent 2014 American Urological Association guideline on "Medical Management of Kidney Stones" and specifically discussed dietary therapies including:  Increase fluid intake to achieve urine output of at least 2.5 liters daily  Limit sodium intake to no more than 100 mEq (2,300 mg) per day  Consume 1,000-1,200 mg of dietary calcium per day  Limit oxalate-rich foods (beets, spinach, rhubarb, nuts, chocolate)  Limit non-dairy animal protein  Encouraged incresed fruit and vegetable intake        Arlana Hove, DO     A combined total of 30 minutes were spent preparing to see the patient, reviewing previous  records, ordering tests/medications/procedures, documenting the clinical encounter as well as performing a medically appropriate evaluation and independently interpreting results and communicating them to the patient/family/caregiver as specifically outlined above in the impression and plan.    This note may have been fully or partially generated using MModal Fluency Direct system, and there may be some incorrect words, spellings, and punctuation that were not identified in checking the note before saving.

## 2023-07-29 ENCOUNTER — Telehealth (INDEPENDENT_AMBULATORY_CARE_PROVIDER_SITE_OTHER): Payer: Self-pay | Admitting: Surgery

## 2023-07-29 NOTE — Telephone Encounter (Signed)
Shellie notified. Voiced understanding.   Meriam Sprague, LPN  [id number] 98:11
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
## 2023-07-29 NOTE — Telephone Encounter (Signed)
Would you let him know that I will review the chart and call him tomorrow

## 2023-09-10 ENCOUNTER — Other Ambulatory Visit (HOSPITAL_COMMUNITY): Payer: Self-pay | Admitting: PHYSICIAN/UNDEFINED PHYSICIAN TYPE

## 2023-09-10 DIAGNOSIS — Z1231 Encounter for screening mammogram for malignant neoplasm of breast: Secondary | ICD-10-CM

## 2023-09-26 ENCOUNTER — Encounter (HOSPITAL_COMMUNITY): Payer: Self-pay

## 2023-09-26 ENCOUNTER — Other Ambulatory Visit: Payer: Self-pay

## 2023-09-26 ENCOUNTER — Inpatient Hospital Stay
Admission: RE | Admit: 2023-09-26 | Discharge: 2023-09-26 | Disposition: A | Discharge status: Home / Self Care | Payer: Medicare HMO | Source: Ambulatory Visit | Attending: PHYSICIAN/UNDEFINED PHYSICIAN TYPE

## 2023-09-26 DIAGNOSIS — Z1231 Encounter for screening mammogram for malignant neoplasm of breast: Secondary | ICD-10-CM | POA: Insufficient documentation

## 2023-12-16 ENCOUNTER — Other Ambulatory Visit: Payer: Self-pay

## 2023-12-16 ENCOUNTER — Ambulatory Visit: Payer: Medicare HMO | Attending: PHYSICIAN/UNDEFINED PHYSICIAN TYPE

## 2023-12-16 DIAGNOSIS — Z0000 Encounter for general adult medical examination without abnormal findings: Secondary | ICD-10-CM | POA: Insufficient documentation

## 2023-12-16 DIAGNOSIS — I10 Essential (primary) hypertension: Secondary | ICD-10-CM | POA: Insufficient documentation

## 2023-12-16 LAB — COMPREHENSIVE METABOLIC PANEL, NON-FASTING
ALBUMIN/GLOBULIN RATIO: 1.7 — ABNORMAL HIGH (ref 0.8–1.4)
ALBUMIN: 4.5 g/dL (ref 3.5–5.7)
ALKALINE PHOSPHATASE: 99 U/L (ref 34–104)
ALT (SGPT): 14 U/L (ref 7–52)
ANION GAP: 9 mmol/L (ref 4–13)
AST (SGOT): 17 U/L (ref 13–39)
BILIRUBIN TOTAL: 0.7 mg/dL (ref 0.3–1.0)
BUN/CREA RATIO: 18 (ref 6–22)
BUN: 18 mg/dL (ref 7–25)
CALCIUM, CORRECTED: 9.7 mg/dL (ref 8.9–10.8)
CALCIUM: 10.1 mg/dL (ref 8.6–10.3)
CHLORIDE: 110 mmol/L — ABNORMAL HIGH (ref 98–107)
CO2 TOTAL: 23 mmol/L (ref 21–31)
CREATININE: 1 mg/dL (ref 0.60–1.30)
ESTIMATED GFR: 58 mL/min/{1.73_m2} — ABNORMAL LOW (ref >59)
GLOBULIN: 2.7 (ref 2.0–3.5)
GLUCOSE: 116 mg/dL — ABNORMAL HIGH (ref 74–109)
OSMOLALITY, CALCULATED: 286 mosm/kg (ref 270–290)
POTASSIUM: 4.3 mmol/L (ref 3.5–5.1)
PROTEIN TOTAL: 7.2 g/dL (ref 6.4–8.9)
SODIUM: 142 mmol/L (ref 136–145)

## 2023-12-16 LAB — URINALYSIS, MICROSCOPIC
RBCS: 1 /[HPF] (ref <4)
SQUAMOUS EPITHELIAL: <1 /[HPF] (ref <28)
WBCS: 6 /[HPF] — ABNORMAL HIGH (ref <6)

## 2023-12-16 LAB — CBC WITH DIFF
BASOPHIL #: 0 10*3/uL (ref 0.00–0.10)
BASOPHIL %: 1 % (ref 0–1)
EOSINOPHIL #: 0.2 10*3/uL (ref 0.00–0.50)
EOSINOPHIL %: 3 % (ref 1–7)
HCT: 45.9 % — ABNORMAL HIGH (ref 31.2–41.9)
HGB: 15.2 g/dL — ABNORMAL HIGH (ref 10.9–14.3)
LYMPHOCYTE #: 2.2 10*3/uL (ref 1.10–3.10)
LYMPHOCYTE %: 36 % (ref 16–46)
MCH: 29.5 pg (ref 24.7–32.8)
MCHC: 33.2 g/dL (ref 32.3–35.6)
MCV: 88.9 fL (ref 75.5–95.3)
MONOCYTE #: 0.5 10*3/uL (ref 0.20–0.90)
MONOCYTE %: 8 % (ref 4–11)
MPV: 8.8 fL (ref 7.9–10.8)
NEUTROPHIL #: 3.2 10*3/uL (ref 1.90–8.20)
NEUTROPHIL %: 52 % (ref 43–77)
PLATELETS: 300 10*3/uL (ref 140–440)
RBC: 5.17 10*6/uL — ABNORMAL HIGH (ref 3.63–4.92)
RDW: 12.8 % (ref 12.3–17.7)
WBC: 6.2 10*3/uL (ref 3.8–11.8)

## 2023-12-16 LAB — URINALYSIS, MACROSCOPIC
BILIRUBIN: NEGATIVE mg/dL
BLOOD: NEGATIVE mg/dL
GLUCOSE: NEGATIVE mg/dL
KETONES: NEGATIVE mg/dL
LEUKOCYTES: 25 WBCs/uL — AB
NITRITE: NEGATIVE
PH: 6 (ref 5.0–9.0)
PROTEIN: NEGATIVE mg/dL
SPECIFIC GRAVITY: 1.019 (ref 1.002–1.030)
UROBILINOGEN: NORMAL mg/dL

## 2023-12-17 ENCOUNTER — Encounter (INDEPENDENT_AMBULATORY_CARE_PROVIDER_SITE_OTHER): Payer: Self-pay | Admitting: Student in an Organized Health Care Education/Training Program

## 2024-01-08 ENCOUNTER — Other Ambulatory Visit: Payer: Self-pay

## 2024-01-08 ENCOUNTER — Ambulatory Visit
Admission: RE | Admit: 2024-01-08 | Discharge: 2024-01-08 | Disposition: A | Discharge status: Home / Self Care | Source: Ambulatory Visit | Attending: Student in an Organized Health Care Education/Training Program | Admitting: Student in an Organized Health Care Education/Training Program

## 2024-01-08 DIAGNOSIS — N200 Calculus of kidney: Secondary | ICD-10-CM | POA: Insufficient documentation

## 2024-01-15 ENCOUNTER — Encounter (INDEPENDENT_AMBULATORY_CARE_PROVIDER_SITE_OTHER): Payer: Medicare HMO | Admitting: Physician Assistant

## 2024-02-03 ENCOUNTER — Encounter (INDEPENDENT_AMBULATORY_CARE_PROVIDER_SITE_OTHER): Payer: Self-pay

## 2024-03-02 ENCOUNTER — Ambulatory Visit (INDEPENDENT_AMBULATORY_CARE_PROVIDER_SITE_OTHER): Admitting: Physician Assistant

## 2024-03-02 ENCOUNTER — Encounter (INDEPENDENT_AMBULATORY_CARE_PROVIDER_SITE_OTHER): Payer: Self-pay | Admitting: Physician Assistant

## 2024-03-02 ENCOUNTER — Other Ambulatory Visit: Payer: Self-pay

## 2024-03-02 VITALS — BP 167/87 | HR 91 | Ht 67.0 in | Wt 135.8 lb

## 2024-03-02 DIAGNOSIS — R319 Hematuria, unspecified: Secondary | ICD-10-CM

## 2024-03-02 DIAGNOSIS — N200 Calculus of kidney: Secondary | ICD-10-CM

## 2024-03-02 DIAGNOSIS — Z9889 Other specified postprocedural states: Secondary | ICD-10-CM

## 2024-03-02 DIAGNOSIS — Z87440 Personal history of urinary (tract) infections: Secondary | ICD-10-CM

## 2024-03-02 MED ORDER — ESTRADIOL 0.01% (0.1 MG/GRAM) VAGINAL CREAM
TOPICAL_CREAM | VAGINAL | 11 refills | Status: AC
Start: 2024-03-02 — End: ?

## 2024-03-02 NOTE — Progress Notes (Signed)
 UROLOGY, [facility]  [address] [place] [address]    Progress Note    Name: Leslie Hall MRN:  [id number]   Date: 03/02/2024 DOB:  1946/09/11 (77 y.o.)             Chief Complaint: Follow Up 3 Months and Kidney Stones (XR KUB 01/08/24)  Subjective   Subjective:   Leslie Hall is a pleasant 78 year old female who recently experienced an episode of urosepsis and obstructive uropathy secondary to a right distal ureteral calculus requiring transfer to [facility] with a nephrostomy tube 2023. She presents for follow up of history of nephrolithiasis and UTIs. He reports generally feeling unwell and urinary frequency treated for UTI in April by PCP with Bactrim and symptoms resolved. Recent 01/08/24 KUB  reports 2 calcifications projected at the lateral margin of the left renal shadow. The largest of these measures 6 mm. She denies fever,chills, dysuria.        Objective   Objective :  BP (!) 167/87 (Site: Right Arm, Patient Position: Sitting, Cuff Size: Adult)   Pulse 91   Ht 1.702 m (5\' 7" )   Wt 61.6 kg (135 lb 12.8 oz)   BMI 21.27 kg/m       Gen: NAD, alert  Pulm: unlabored at rest  CV: palpable pulses  Abd: soft, Nt/ND  GU: no suprapubic tenderness, no CVAT        Data reviewed:    Current Outpatient Medications   Medication Sig    amLODIPine (NORVASC) 5 mg Oral Tablet Take 1 Tablet (5 mg total) by mouth Once a day    aspirin (ECOTRIN) 81 mg Oral Tablet, Delayed Release (E.C.) Take by mouth Once a day    famotidine  (PEPCID ) 40 mg Oral Tablet Take 1 Tablet (40 mg total) by mouth Every night (Patient not taking: Reported on 09/12/2022)    montelukast (SINGULAIR) 10 mg Oral Tablet Take 1 Tablet (10 mg total) by mouth Once a day    omeprazole (PRILOSEC) 40 mg Oral Capsule, Delayed Release(E.C.) Take 1 Capsule (40 mg total) by mouth Once a day (Patient not taking: Reported on 09/12/2022)    vit C/E/Zn/coppr/lutein/zeaxan (PRESERVISION AREDS-2 ORAL) Take 1 Tablet by mouth Twice daily         Assessment/Plan  Problem List Items Addressed This Visit    None      History of bilateral non-obstructing nephrolithiasis, 6mm right, 7mm left; s/p left URS on 09/12/2022 & right URS on 09/26/2022    History of right distal ureterolithiasis, 5mm, status post right nephrostomy tube placement on 07/29/2022 due to septic shock 2/2 urosepsis s/p right URS and stent removal on 09/12/2022    Right renal calculi suspected on KUB  CT Abdomen/Pelvis w/o con     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  We also reviewed the recent 2014 American Urological Association guideline on "Medical Management of Kidney Stones" and specifically discussed dietary therapies including:  Increase fluid intake to achieve urine output of at least 2.5 liters daily  Limit sodium intake to no more than 100 mEq (2,300 mg) per day  Consume 1,000-1,200 mg of dietary calcium per day  Limit oxalate-rich foods (beets, spinach, rhubarb, nuts, chocolate)  Limit non-dairy animal protein  Encouraged increased fruit and vegetable intake    Recurrent UTIs  I discussed the epidemiology, pathogenesis, and prevention of  recurrent acute uncomplicated cystitis and pyelonephritis of recurrent urinary tract infections with patient  We discussed that, while bothersome, there is currently no evidence linking recurrent infections to significant health problems, such as hypertension or renal disease, in the absence of anatomic or functional abnormalities of the urinary tract.  We spent the majority of our time discussing various prevention strategies, including:  Behavioral modifications:  If sexually active, avoidance of spermicides  While controlled studies have not shown post-coital voiding and liberal fluid intake to be associated with a reduced risk of recurrent UTI, these measures are unlikely to be  harmful  Cranberry compounds:  While there is likely little harmful effect other than an increase in calorie and glucose intake with cranberry juice, there remains a limited proven role; however, consideration may be given to a well-studied cranberry pill supplements such as TheraCran HP (Theralogix) which has a standardized formula and has been shown in scientific studies to promote urinary tract health  Antimicrobial prophylaxis options (continuous, postcoital, or self-start therapy) were also discussed for women who experience two or more symptomatic UTIs within six months or three or more over 12 months  We discussed balancing the degree of discomfort experienced from these infections with real concerns about antimicrobial resistance  The prophylactic efficacy of any antimicrobial agent depends upon the continued susceptibility to the drug of potential uropathogens colonizing patient's fecal, periurethral, and vaginal flora and must be weighed against emerging uropathogen resistance patterns  Topical estrogen for postmenopausal women as a means to increase the prevalence of lactobacilli and decrease in E. coli vaginal colonization  We also discussed other potential, albeit unproven strategies to potentially reduce risk of UTI recurrence including:  Probiotics  Urinary antiseptics including methenamine salts (HiprexT, UrexT)  While generally of a low diagnostic yield, would recommend further work-up in the future including RBUS, CT scan, and cystoscopy  Add Cranberry supplement daily  Add D-mannose supplement daily  Prescription provided for Intravaginal topical conjugated estrogen cream (PremarinT) apply pea-sized amount to urethral opening nightly for 1 month then Monday-Wednesday-Friday      A combined total of 30 minutes were spent preparing to see the patient, reviewing previous records, ordering tests/medications/procedures, documenting the clinical encounter as well as performing a medically appropriate  evaluation and independently interpreting results and communicating them to the patient/family/caregiver as specifically outlined above in the impression and plan.

## 2024-04-06 ENCOUNTER — Other Ambulatory Visit: Payer: Self-pay

## 2024-04-06 ENCOUNTER — Ambulatory Visit
Admission: RE | Admit: 2024-04-06 | Discharge: 2024-04-06 | Disposition: A | Discharge status: Home / Self Care | Payer: Self-pay | Source: Ambulatory Visit | Attending: Physician Assistant | Admitting: Physician Assistant

## 2024-04-06 DIAGNOSIS — N200 Calculus of kidney: Secondary | ICD-10-CM

## 2024-04-06 DIAGNOSIS — R319 Hematuria, unspecified: Secondary | ICD-10-CM | POA: Insufficient documentation

## 2024-05-04 ENCOUNTER — Other Ambulatory Visit: Payer: Self-pay

## 2024-05-04 ENCOUNTER — Encounter (INDEPENDENT_AMBULATORY_CARE_PROVIDER_SITE_OTHER): Payer: Self-pay | Admitting: Physician Assistant

## 2024-05-04 ENCOUNTER — Ambulatory Visit (INDEPENDENT_AMBULATORY_CARE_PROVIDER_SITE_OTHER): Payer: Self-pay | Admitting: Physician Assistant

## 2024-05-04 VITALS — BP 171/92 | HR 90 | Ht 67.0 in | Wt 134.8 lb

## 2024-05-04 DIAGNOSIS — N200 Calculus of kidney: Secondary | ICD-10-CM

## 2024-05-04 DIAGNOSIS — N390 Urinary tract infection, site not specified: Secondary | ICD-10-CM

## 2024-05-04 DIAGNOSIS — Z9889 Other specified postprocedural states: Secondary | ICD-10-CM

## 2024-05-04 DIAGNOSIS — Z87440 Personal history of urinary (tract) infections: Secondary | ICD-10-CM

## 2024-05-04 NOTE — Progress Notes (Signed)
 UROLOGY, [facility]  [address] [place] [address]    Progress Note    Name: Leslie Hall MRN:  [id number]   Date: 05/04/2024 DOB:  Apr 12, 1946 (77 y.o.)             Chief Complaint: Kidney Stones (F/U on CT 04/06/24)  Subjective   Subjective:   Leslie Hall is a pleasant 78 year old female who recently experienced an episode of urosepsis and obstructive uropathy secondary to a right distal ureteral calculus requiring transfer to [facility] with a nephrostomy tube 2023.     She presents for follow up of history of nephrolithiasis and UTIs. She denies any UTIs in interval. She is using d-mannose and cranberry gummies daily. She has not yet started premarin cream. Recent CT Abdomen/Pelvis wo con 04/06/24 reports Kidneys: There are bilateral nonobstructing kidney stones. There is a 5 mm stone in the lower pole of the left kidney and a 4 mm stone in the mid right kidney. No hydronephrosis in either kidney. There are some low-density areas in the central left kidney likely parapelvic cysts.   He denies fever, chills, suprapubic pain, nausea, vomiting, flank pain, dysuria, or hematuria.             Objective   Objective :  BP (!) 171/92 (Site: Right Arm, Patient Position: Sitting, Cuff Size: Adult)   Pulse 90   Ht 1.702 m (5' 7)   Wt 61.1 kg (134 lb 12.8 oz)   BMI 21.11 kg/m       Gen: NAD, alert  Pulm: unlabored at rest  CV: palpable pulses  Abd: soft, Nt/ND  GU: no suprapubic tenderness, no CVAT        Data reviewed:    Current Outpatient Medications   Medication Sig    amLODIPine (NORVASC) 5 mg Oral Tablet Take 1 Tablet (5 mg total) by mouth Once a day    aspirin (ECOTRIN) 81 mg Oral Tablet, Delayed Release (E.C.) Take by mouth Once a day    estradioL  (ESTRACE ) 0.01 % (0.1 mg/gram) Vaginal Cream apply pea-sized amount to urethral opening nightly for 1 month then Monday-Wednesday-Friday    famotidine  (PEPCID ) 40 mg Oral Tablet Take 1 Tablet (40 mg total) by mouth Every night (Patient not  taking: Reported on 09/12/2022)    montelukast (SINGULAIR) 10 mg Oral Tablet Take 1 Tablet (10 mg total) by mouth Once a day    omeprazole (PRILOSEC) 40 mg Oral Capsule, Delayed Release(E.C.) Take 1 Capsule (40 mg total) by mouth Once a day (Patient not taking: Reported on 09/12/2022)    vit C/E/Zn/coppr/lutein/zeaxan (PRESERVISION AREDS-2 ORAL) Take 1 Tablet by mouth Twice daily        Assessment/Plan  Problem List Items Addressed This Visit    None      History of bilateral non-obstructing nephrolithiasis, 6mm right, 7mm left; s/p left URS on 09/12/2022 & right URS on 09/26/2022    History of right distal ureterolithiasis, 5mm, status post right nephrostomy tube placement on 07/29/2022 due to septic shock 2/2 urosepsis s/p right URS and stent removal on 09/12/2022    Bilateral nephrolithiasis,right 4mm and left 5mm  CT Abdomen/Pelvis w/o con reviewed     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  We also reviewed the recent 2014 American Urological Association guideline on Medical Management of Kidney Stones and specifically discussed dietary therapies  including:  Increase fluid intake to achieve urine output of at least 2.5 liters daily  Limit sodium intake to no more than 100 mEq (2,300 mg) per day  Consume 1,000-1,200 mg of dietary calcium per day  Limit oxalate-rich foods (beets, spinach, rhubarb, nuts, chocolate)  Limit non-dairy animal protein  Encouraged increased fruit and vegetable intake    Recurrent UTIs  I discussed the epidemiology, pathogenesis, and prevention of recurrent acute uncomplicated cystitis and pyelonephritis of recurrent urinary tract infections with patient  Topical estrogen for postmenopausal women as a means to increase the prevalence of lactobacilli and decrease in E. coli vaginal colonization  Continue Cranberry supplement  daily  Continue D-mannose supplement daily  Continue Intravaginal topical conjugated estrogen cream (PremarinT) apply pea-sized amount to urethral opening nightly for 1 month then Monday-Wednesday-Friday      A combined total of 30 minutes were spent preparing to see the patient, reviewing previous records, ordering tests/medications/procedures, documenting the clinical encounter as well as performing a medically appropriate evaluation and independently interpreting results and communicating them to the patient/family/caregiver as specifically outlined above in the impression and plan.

## 2024-05-28 ENCOUNTER — Ambulatory Visit: Attending: Surgery

## 2024-05-28 ENCOUNTER — Ambulatory Visit (HOSPITAL_BASED_OUTPATIENT_CLINIC_OR_DEPARTMENT_OTHER)
Admission: RE | Admit: 2024-05-28 | Discharge: 2024-05-28 | Disposition: A | Discharge status: Home / Self Care | Source: Ambulatory Visit | Attending: Surgery | Admitting: Surgery

## 2024-05-28 ENCOUNTER — Encounter (INDEPENDENT_AMBULATORY_CARE_PROVIDER_SITE_OTHER): Payer: Self-pay | Admitting: Surgery

## 2024-05-28 ENCOUNTER — Other Ambulatory Visit (INDEPENDENT_AMBULATORY_CARE_PROVIDER_SITE_OTHER): Payer: Self-pay | Admitting: Surgery

## 2024-05-28 ENCOUNTER — Other Ambulatory Visit: Payer: Self-pay

## 2024-05-28 ENCOUNTER — Ambulatory Visit (INDEPENDENT_AMBULATORY_CARE_PROVIDER_SITE_OTHER): Payer: Self-pay | Admitting: Surgery

## 2024-05-28 VITALS — BP 136/108 | HR 99 | Temp 97.3°F | Ht 67.0 in | Wt 135.8 lb

## 2024-05-28 DIAGNOSIS — L989 Disorder of the skin and subcutaneous tissue, unspecified: Secondary | ICD-10-CM | POA: Insufficient documentation

## 2024-05-28 DIAGNOSIS — Z01818 Encounter for other preprocedural examination: Secondary | ICD-10-CM | POA: Insufficient documentation

## 2024-05-28 DIAGNOSIS — C44702 Unspecified malignant neoplasm of skin of right lower limb, including hip: Secondary | ICD-10-CM

## 2024-05-28 LAB — BASIC METABOLIC PANEL
ANION GAP: 11 mmol/L (ref 4–13)
BUN/CREA RATIO: 21 (ref 6–22)
BUN: 24 mg/dL (ref 7–25)
CALCIUM: 10.2 mg/dL (ref 8.6–10.3)
CHLORIDE: 108 mmol/L — ABNORMAL HIGH (ref 98–107)
CO2 TOTAL: 19 mmol/L — ABNORMAL LOW (ref 21–31)
CREATININE: 1.13 mg/dL (ref 0.60–1.30)
ESTIMATED GFR: 50 mL/min/1.73mˆ2 — ABNORMAL LOW (ref >59)
GLUCOSE: 98 mg/dL (ref 74–109)
OSMOLALITY, CALCULATED: 280 mosm/kg (ref 270–290)
POTASSIUM: 4 mmol/L (ref 3.5–5.1)
SODIUM: 138 mmol/L (ref 136–145)

## 2024-05-28 LAB — CBC WITH DIFF
BASOPHIL #: 0 x10ˆ3/uL (ref 0.00–0.10)
BASOPHIL %: 1 % (ref 0–1)
EOSINOPHIL #: 0.1 x10ˆ3/uL (ref 0.00–0.50)
EOSINOPHIL %: 2 % (ref 1–7)
HCT: 44.5 % — ABNORMAL HIGH (ref 31.2–41.9)
HGB: 15.3 g/dL — ABNORMAL HIGH (ref 10.9–14.3)
LYMPHOCYTE #: 2.2 x10ˆ3/uL (ref 1.10–3.10)
LYMPHOCYTE %: 29 % (ref 16–46)
MCH: 30.2 pg (ref 24.7–32.8)
MCHC: 34.3 g/dL (ref 32.3–35.6)
MCV: 88.1 fL (ref 75.5–95.3)
MONOCYTE #: 0.6 x10ˆ3/uL (ref 0.20–0.90)
MONOCYTE %: 8 % (ref 4–11)
MPV: 7.9 fL (ref 7.9–10.8)
NEUTROPHIL #: 4.5 x10ˆ3/uL (ref 1.90–8.20)
NEUTROPHIL %: 61 % (ref 43–77)
PLATELETS: 275 x10ˆ3/uL (ref 140–440)
RBC: 5.05 x10ˆ6/uL — ABNORMAL HIGH (ref 3.63–4.92)
RDW: 12.9 % (ref 12.3–17.7)
WBC: 7.4 x10ˆ3/uL (ref 3.8–11.8)

## 2024-05-28 NOTE — Progress Notes (Signed)
 GENERAL SURGERY, [facility]  [address]  [place] [address]    History and Physical    Name: Leslie Hall MRN:  [id number]   Date: 05/28/2024 DOB:  February 15, 1946 (77 y.o.)              Reason for Visit: Lesions (Right thigh; left nasal )    History of Present Illness  Leslie Hall presents today for evaluation and management of an irritated raised right thigh lesion which the patient has had for 3 years.  It chronically gets irritated and bleeds.  She also has a small lesion almost send her nose along the crease that needs biopsied.    Negative diabetes, blood thinner      Review of the result(s) of each unique test:  Patient underwent diagnostic testing ( none ) prior to this dates visit.  I have personally reviewed the results and that serves as a component of the medical decision making for this encounter       Review of prior external note(s) from each unique source:  Patients referral to this office including a recent assessment by the referring provider.  This was reviewed by me for this unique office visit for the indication and intent of the referral as well as any pertinent medical or surgical history relevant to the patients independent evaluation by me today.      Patient History  Past Medical History:   Diagnosis Date    Hypertension     Kidney stones     Mixed hyperlipidemia     Pneumonia     Skin cancer     forehead         Past Surgical History:   Procedure Laterality Date    HEEL SPUR SURGERY      HX BREAST BIOPSY Bilateral     yrs ago -    HX HYSTERECTOMY      HX TRIGGER FINGER RELEASE      NEPHROSTOMY      SKIN CANCER EXCISION      forehead    URETERAL STENT PLACEMENT           Current Outpatient Medications   Medication Sig    amLODIPine (NORVASC) 5 mg Oral Tablet Take 1 Tablet (5 mg total) by mouth Once a day    aspirin (ECOTRIN) 81 mg Oral Tablet, Delayed Release (E.C.) Take by mouth Once a day    estradioL  (ESTRACE ) 0.01 % (0.1 mg/gram) Vaginal Cream apply  pea-sized amount to urethral opening nightly for 1 month then Monday-Wednesday-Friday    montelukast (SINGULAIR) 10 mg Oral Tablet Take 1 Tablet (10 mg total) by mouth Once a day    omeprazole (PRILOSEC) 40 mg Oral Capsule, Delayed Release(E.C.) Take 1 Capsule (40 mg total) by mouth Once a day     Allergies[1]  Family Medical History:       Problem Relation (Age of Onset)    Breast Cancer Maternal Aunt, Maternal Aunt    No Known Problems Mother, Father, Sister, Brother, Maternal Grandmother, Maternal Grandfather, Paternal Grandmother, Paternal Grandfather, Daughter, Son, Maternal Uncle, Paternal Aunt, Paternal Uncle, Other            Social History[2]         Physical Examination:  Vitals:    05/28/24 1516   BP: (!) 136/108   Pulse: 99   Temp: 36.3 C (97.3 F)   SpO2: 97%   Weight: 61.6 kg (135 lb 12.8 oz)  Height: 1.702 m (5' 7)   BMI: 21.27        General: appropriate for age. in no acute distress.    Vital signs are present above and have been reviewed by me     HEENT: Atraumatic, Normocephalic. PERRLA. EOMI. Nose clear. Throat clear    Small nodular lesion on the left side of the nose along the alar crease.    Lungs: Nonlabored breathing with symmetric expansion. Clear to auscultation bilaterally    Heart:Regular wth respect to rate and rythmn.    Abdomen:Soft. Nontender. Nondistended as above    Extremities: Grossly normal. No major deformities.  Irritated raised right thigh lesion highly suspicious for skin cancer      Neuro:  Grossly normal motor and sensory function    Psychiatric: Alert and oriented to person, place, and time. affect appropriate      Assessment and Plan  Wide local excision of right thigh lesion and biopsy of left nasal lesion scheduled for 06/09/2024    Anesthesia local MAC.    Risks, benefits, indications, complications of elective excision of these lesion(s) were discussed in detail including but not limited to bleeding, infection, reaction to anesthesia, need for further surgery and  non-cosmetic result of the scar.   All questions were answered to the patient`s satisfaction with informed consent clearly obtained.      Follow Up:  No follow-ups on file.      ICD-10-CM    1. 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 External nasal lesion  L98.9           Tylie Golonka B Ziggy Chanthavong, MD ,MBA,FACS    I appreciate the opportunity to be involved in the care of your patients.  If you have any questions or concerns regarding this encounter, please do not hesitate to contact me at your convenience.      This note may have been partially generated using MModal Fluency Direct system, and there may be some incorrect words, spellings, and punctuation that were not noted in checking the note before saving, though effort was made to avoid such errors.               [1]   Allergies  Allergen Reactions    Morphine   Other Adverse Reaction (Add comment)     BP drop shaking     Latex Rash    Amitriptyline  Other Adverse Reaction (Add comment)     Heart issues    Ciprofloxacin  Other Adverse Reaction (Add comment)     Unsure      Me-Prednisolone Sod Succ Lyoph [Methylprednisolone Sodium Succ]  Other Adverse Reaction (Add comment)     Vertigo      Mobic [Meloxicam]  Other Adverse Reaction (Add comment)     Gi isssues    Niacin  Other Adverse Reaction (Add comment)     Burning ears      Penicillins  Other Adverse Reaction (Add comment)     Yeast infections    Tylenol  [Acetaminophen ]  Other Adverse Reaction (Add comment)     Gi issues    Cefdinir Itching   [2]   Social History  Tobacco Use    Smoking status: Never    Smokeless tobacco: Never   Vaping Use    Vaping status: Never Used   Substance Use Topics    Alcohol use: Never    Drug use: Never

## 2024-05-29 DIAGNOSIS — R9431 Abnormal electrocardiogram [ECG] [EKG]: Secondary | ICD-10-CM

## 2024-05-29 DIAGNOSIS — I493 Ventricular premature depolarization: Secondary | ICD-10-CM

## 2024-05-29 LAB — ECG 12 LEAD
Atrial Rate: 93 {beats}/min
Calculated R Axis: 31 degrees
Calculated T Axis: 34 degrees
PR Interval: 134 ms
QRS Duration: 82 ms
QT Interval: 362 ms
QTC Calculation: 450 ms
Ventricular rate: 93 {beats}/min

## 2024-06-08 ENCOUNTER — Encounter (HOSPITAL_COMMUNITY): Payer: Self-pay | Admitting: Surgery

## 2024-06-08 NOTE — Anesthesia Preprocedure Evaluation (Signed)
 ANESTHESIA PRE-OP EVALUATION  Planned Procedure: WIDE LOCAL EXCISION RIGHT THIGH LESION; BIOPSY OF LEFT NASAL LESION (Right: Thigh)  N/A (Left: Nose)  Review of Systems     anesthesia history negative     patient summary reviewed  nursing notes reviewed        Pulmonary   pneumonia,   Cardiovascular    Hypertension, well controlled, ECG reviewed and hyperlipidemia ,No peripheral edema,  Exercise Tolerance: > or = 4 METS        GI/Hepatic/Renal    renal insufficiency and kidney stones        Endo/Other   neg endo/other ROS,       Neuro/Psych/MS   negative neuro/psych ROS,      Cancer    negative hematology/oncology ROS,               Physical Assessment      Airway       Mallampati: II    TM distance: 3 FB    Neck ROM: full  Mouth Opening: good.            Dental       Dentition intact             Pulmonary    Breath sounds clear to auscultation  (-) no rhonchi, no decreased breath sounds, no wheezes, no rales and no stridor     Cardiovascular    Rhythm: regular  Rate: Normal  (-) no friction rub, carotid bruit is not present, no peripheral edema and no murmur     Other findings          Plan  ASA 2     Planned anesthesia type: MAC           PONV Plan:  I plan to administer pharmcologic prophalaxis antiemetics                  Anesthesia issues/risks discussed are: Dental Injuries, Stroke, Aspiration, Cardiac Events/MI, Intraoperative Awareness/ Recall, Sore Throat and Post-op Pain Management.  Anesthetic plan and risks discussed with patient  signed consent obtained          Patient's NPO status is appropriate for Anesthesia.           Plan discussed with CRNA.

## 2024-06-09 ENCOUNTER — Ambulatory Visit (HOSPITAL_COMMUNITY): Admitting: Surgery

## 2024-06-09 ENCOUNTER — Encounter (HOSPITAL_COMMUNITY): Admitting: Anesthesiology

## 2024-06-09 ENCOUNTER — Ambulatory Visit (HOSPITAL_COMMUNITY): Admitting: Anesthesiology

## 2024-06-09 ENCOUNTER — Other Ambulatory Visit: Payer: Self-pay

## 2024-06-09 ENCOUNTER — Encounter (HOSPITAL_COMMUNITY)
Admission: RE | Disposition: A | Discharge status: Home / Self Care | Payer: Self-pay | Source: Ambulatory Visit | Attending: Surgery

## 2024-06-09 ENCOUNTER — Ambulatory Visit
Admission: RE | Admit: 2024-06-09 | Discharge: 2024-06-09 | Disposition: A | Discharge status: Home / Self Care | Source: Ambulatory Visit | Attending: Surgery | Admitting: Surgery

## 2024-06-09 ENCOUNTER — Encounter (HOSPITAL_COMMUNITY): Payer: Self-pay | Admitting: Surgery

## 2024-06-09 DIAGNOSIS — C44311 Basal cell carcinoma of skin of nose: Secondary | ICD-10-CM | POA: Insufficient documentation

## 2024-06-09 DIAGNOSIS — C44712 Basal cell carcinoma of skin of right lower limb, including hip: Secondary | ICD-10-CM | POA: Insufficient documentation

## 2024-06-09 SURGERY — EXCISION CYST
Anesthesia: Monitor Anesthesia Care | Site: Thigh | Laterality: Right | Wound class: Clean Wound: Uninfected operative wounds in which no inflammation occurred

## 2024-06-09 MED ORDER — BACITRACIN ZINC 500 UNIT-POLYMYXIN B 10,000 UNIT/GRAM TOPICAL OINTMENT
TOPICAL_OINTMENT | CUTANEOUS | Status: AC
Start: 2024-06-09 — End: 2024-06-09
  Filled 2024-06-09: qty 14.2

## 2024-06-09 MED ORDER — BACITRACIN 500 UNIT/G OINTMENT TUBE
TOPICAL_OINTMENT | Freq: Two times a day (BID) | CUTANEOUS | Status: DC | PRN
Start: 2024-06-09 — End: 2024-06-09

## 2024-06-09 MED ORDER — SODIUM CHLORIDE 0.9 % (FLUSH) INJECTION SYRINGE
3.0000 mL | INJECTION | INTRAMUSCULAR | Status: DC | PRN
Start: 2024-06-09 — End: 2024-06-09

## 2024-06-09 MED ORDER — MIDAZOLAM 5 MG/ML INJECTION WRAPPER
INTRAMUSCULAR | Status: AC
Start: 2024-06-09 — End: 2024-06-09
  Filled 2024-06-09: qty 1

## 2024-06-09 MED ORDER — ACETAMINOPHEN 325 MG TABLET
650.0000 mg | ORAL_TABLET | ORAL | Status: DC | PRN
Start: 2024-06-09 — End: 2024-06-09

## 2024-06-09 MED ORDER — ONDANSETRON 4 MG DISINTEGRATING TABLET
4.0000 mg | ORAL_TABLET | Freq: Four times a day (QID) | ORAL | Status: DC | PRN
Start: 2024-06-09 — End: 2024-06-09

## 2024-06-09 MED ORDER — ONDANSETRON HCL (PF) 4 MG/2 ML INJECTION SOLUTION
4.0000 mg | Freq: Four times a day (QID) | INTRAMUSCULAR | Status: DC | PRN
Start: 2024-06-09 — End: 2024-06-09

## 2024-06-09 MED ORDER — HYDROMORPHONE 2 MG/ML INJECTION WRAPPER
0.2000 mg | INJECTION | INTRAMUSCULAR | Status: DC | PRN
Start: 2024-06-09 — End: 2024-06-09

## 2024-06-09 MED ORDER — IPRATROPIUM 0.5 MG-ALBUTEROL 3 MG (2.5 MG BASE)/3 ML NEBULIZATION SOLN
3.0000 mL | INHALATION_SOLUTION | Freq: Once | RESPIRATORY_TRACT | Status: DC | PRN
Start: 2024-06-09 — End: 2024-06-09

## 2024-06-09 MED ORDER — FAMOTIDINE (PF) 20 MG/2 ML INTRAVENOUS SOLUTION
20.0000 mg | Freq: Once | INTRAVENOUS | Status: AC
Start: 2024-06-09 — End: 2024-06-09
  Administered 2024-06-09: 20 mg via INTRAVENOUS

## 2024-06-09 MED ORDER — DEXTROSE 5% IN WATER (D5W) FLUSH BAG - 250 ML
INTRAVENOUS | Status: DC | PRN
Start: 2024-06-09 — End: 2024-06-09

## 2024-06-09 MED ORDER — ROPIVACAINE (PF) 2 MG/ML (0.2 %) INJECTION SOLUTION
Freq: Once | INTRAMUSCULAR | Status: DC | PRN
Start: 2024-06-09 — End: 2024-06-09
  Administered 2024-06-09: 18 mL via INTRAMUSCULAR

## 2024-06-09 MED ORDER — LIDOCAINE 1 %-EPINEPHRINE 1:100,000 INJECTION SOLUTION
Freq: Once | INTRAMUSCULAR | Status: DC | PRN
Start: 2024-06-09 — End: 2024-06-09
  Administered 2024-06-09: 6 mL via INTRAMUSCULAR

## 2024-06-09 MED ORDER — SODIUM CHLORIDE 0.9% FLUSH BAG - 250 ML
INTRAVENOUS | Status: DC | PRN
Start: 2024-06-09 — End: 2024-06-09

## 2024-06-09 MED ORDER — METOCLOPRAMIDE 5 MG/ML INJECTION SOLUTION
10.0000 mg | Freq: Four times a day (QID) | INTRAMUSCULAR | Status: DC | PRN
Start: 2024-06-09 — End: 2024-06-09

## 2024-06-09 MED ORDER — MIDAZOLAM 5 MG/ML INJECTION WRAPPER
Freq: Once | INTRAMUSCULAR | Status: DC | PRN
Start: 2024-06-09 — End: 2024-06-09
  Administered 2024-06-09: 1 mg via INTRAVENOUS

## 2024-06-09 MED ORDER — SODIUM CHLORIDE 0.9 % (FLUSH) INJECTION SYRINGE
3.0000 mL | INJECTION | Freq: Three times a day (TID) | INTRAMUSCULAR | Status: DC
Start: 2024-06-09 — End: 2024-06-09

## 2024-06-09 MED ORDER — ONDANSETRON HCL (PF) 4 MG/2 ML INJECTION SOLUTION
4.0000 mg | Freq: Once | INTRAMUSCULAR | Status: DC | PRN
Start: 2024-06-09 — End: 2024-06-09

## 2024-06-09 MED ORDER — DEXAMETHASONE SODIUM PHOSPHATE 4 MG/ML INJECTION SOLUTION
INTRAMUSCULAR | Status: AC
Start: 2024-06-09 — End: 2024-06-09
  Filled 2024-06-09: qty 1

## 2024-06-09 MED ORDER — LACTATED RINGERS INTRAVENOUS SOLUTION
INTRAVENOUS | Status: DC
Start: 2024-06-09 — End: 2024-06-09

## 2024-06-09 MED ORDER — FENTANYL (PF) 50 MCG/ML INJECTION WRAPPER
INJECTION | Freq: Once | INTRAMUSCULAR | Status: DC | PRN
Start: 2024-06-09 — End: 2024-06-09
  Administered 2024-06-09 (×4): 25 ug via INTRAVENOUS

## 2024-06-09 MED ORDER — MORPHINE 2 MG/ML INJECTION WRAPPER
2.0000 mg | INJECTION | INTRAMUSCULAR | Status: DC | PRN
Start: 2024-06-09 — End: 2024-06-09

## 2024-06-09 MED ORDER — CEFAZOLIN 2 GRAM INTRAVENOUS SOLUTION
INTRAVENOUS | Status: AC
Start: 2024-06-09 — End: 2024-06-09
  Filled 2024-06-09: qty 14.71

## 2024-06-09 MED ORDER — LACTATED RINGERS INTRAVENOUS SOLUTION
INTRAVENOUS | Status: DC
Start: 2024-06-09 — End: 2024-06-09
  Administered 2024-06-09: 0 via INTRAVENOUS

## 2024-06-09 MED ORDER — LIDOCAINE 1 %-EPINEPHRINE 1:100,000 INJECTION SOLUTION
INTRAMUSCULAR | Status: AC
Start: 2024-06-09 — End: 2024-06-09
  Filled 2024-06-09: qty 50

## 2024-06-09 MED ORDER — ONDANSETRON HCL (PF) 4 MG/2 ML INJECTION SOLUTION
INTRAMUSCULAR | Status: AC
Start: 2024-06-09 — End: 2024-06-09
  Filled 2024-06-09: qty 2

## 2024-06-09 MED ORDER — PROCHLORPERAZINE EDISYLATE 10 MG/2 ML (5 MG/ML) INJECTION SOLUTION
5.0000 mg | Freq: Once | INTRAMUSCULAR | Status: DC | PRN
Start: 2024-06-09 — End: 2024-06-09

## 2024-06-09 MED ORDER — PROPOFOL 10 MG/ML IV BOLUS
INJECTION | Freq: Once | INTRAVENOUS | Status: DC | PRN
Start: 2024-06-09 — End: 2024-06-09
  Administered 2024-06-09 (×5): 25 mg via INTRAVENOUS

## 2024-06-09 MED ORDER — FAMOTIDINE (PF) 20 MG/2 ML INTRAVENOUS SOLUTION
INTRAVENOUS | Status: AC
Start: 2024-06-09 — End: 2024-06-09
  Filled 2024-06-09: qty 2

## 2024-06-09 MED ORDER — FENTANYL (PF) 50 MCG/ML INJECTION WRAPPER
50.0000 ug | INJECTION | INTRAMUSCULAR | Status: DC | PRN
Start: 2024-06-09 — End: 2024-06-09

## 2024-06-09 MED ORDER — TRAMADOL 50 MG TABLET
1.0000 | ORAL_TABLET | Freq: Four times a day (QID) | ORAL | 1 refills | Status: AC | PRN
Start: 2024-06-09 — End: ?

## 2024-06-09 MED ORDER — ALBUTEROL SULFATE 2.5 MG/3 ML (0.083 %) SOLUTION FOR NEBULIZATION
2.5000 mg | INHALATION_SOLUTION | Freq: Once | RESPIRATORY_TRACT | Status: DC | PRN
Start: 2024-06-09 — End: 2024-06-09

## 2024-06-09 MED ORDER — CEFAZOLIN 1 GRAM SOLUTION FOR INJECTION
Freq: Once | INTRAMUSCULAR | Status: DC | PRN
Start: 2024-06-09 — End: 2024-06-09
  Administered 2024-06-09: 2000 mg via INTRAVENOUS

## 2024-06-09 MED ORDER — SODIUM CHLORIDE 0.9 % INTRAVENOUS SOLUTION
INTRAVENOUS | Status: AC
Start: 2024-06-09 — End: 2024-06-09
  Filled 2024-06-09: qty 50

## 2024-06-09 MED ORDER — FENTANYL (PF) 50 MCG/ML INJECTION WRAPPER
25.0000 ug | INJECTION | INTRAMUSCULAR | Status: DC | PRN
Start: 2024-06-09 — End: 2024-06-09

## 2024-06-09 MED ORDER — HYDROCODONE 5 MG-ACETAMINOPHEN 325 MG TABLET
1.0000 | ORAL_TABLET | ORAL | Status: DC | PRN
Start: 2024-06-09 — End: 2024-06-09

## 2024-06-09 MED ORDER — ROPIVACAINE (PF) 2 MG/ML (0.2 %) INJECTION SOLUTION
INTRAMUSCULAR | Status: AC
Start: 2024-06-09 — End: 2024-06-09
  Filled 2024-06-09: qty 30

## 2024-06-09 MED ORDER — FENTANYL (PF) 50 MCG/ML INJECTION SOLUTION
INTRAMUSCULAR | Status: AC
Start: 2024-06-09 — End: 2024-06-09
  Filled 2024-06-09: qty 2

## 2024-06-09 MED ORDER — ONDANSETRON HCL (PF) 4 MG/2 ML INJECTION SOLUTION
4.0000 mg | Freq: Once | INTRAMUSCULAR | Status: AC
Start: 2024-06-09 — End: 2024-06-09
  Administered 2024-06-09: 4 mg via INTRAVENOUS

## 2024-06-09 MED ORDER — LIDOCAINE (PF) 100 MG/5 ML (2 %) INTRAVENOUS SYRINGE
INJECTION | Freq: Once | INTRAVENOUS | Status: DC | PRN
Start: 2024-06-09 — End: 2024-06-09
  Administered 2024-06-09: 50 mg via INTRAVENOUS

## 2024-06-09 SURGICAL SUPPLY — 25 items
BLADE 10 2 END CBNSTL SURG STRL DISP (SURGICAL CUTTING SUPPLIES) ×2 IMPLANT
BLADE 15 2 END CBNSTL SURG STRL DISP (SURGICAL CUTTING SUPPLIES) ×2 IMPLANT
CLEANER INSTRUMENT PRE-KLENZ 13.5 OZ (MISCELLANEOUS PT CARE ITEMS) ×2 IMPLANT
COUNTER 20 CNT BLOCK ADH NEEDLE STRL LF  RD SHARP FOAM 15.75X11.5X14IN DISP (MED SURG SUPPLIES) ×2 IMPLANT
COVER EQP 90X60IN HVDTY BCK PAD FNFLD BLU STRL (DRAPE/PACKS/SHEETS/OR TOWEL) ×4 IMPLANT
DRAPE 76X55IN 3 QT HALYARD LF  STRL DISP SURG (DRAPE/PACKS/SHEETS/OR TOWEL) ×2 IMPLANT
GLOVE SURG 6.5 LF  PF BEAD CUF STRL CRM 11.3IN PROTEXIS PI PLISPRN THK9.1 MIL (GLOVES AND ACCESSORIES) ×4 IMPLANT
GLOVE SURG 7 LF  PF BEAD CUF STRL CRM 11.8IN PROTEXIS PI PLISPRN THK9.1 MIL (GLOVES AND ACCESSORIES) ×8 IMPLANT
GLOVE SURG 7 LF  PF SMOOTH BEAD CUF INTLK STRL BLU 11.8IN PROTEXIS NEU-THERA PLISPRN THK7.9 MIL (GLOVES AND ACCESSORIES) ×2 IMPLANT
GLOVE SURG 7.5 LF  PF SMOOTH BEAD CUF INTLK STRL BLU 11.8IN PROTEXIS NEU-THERA PLISPRN THK7.9 MIL (GLOVES AND ACCESSORIES) ×2 IMPLANT
GOWN SURG LRG STD LGTH L3 HKLP CLSR RGLN SLEEVE TWL STRL LF  DISP GRN AERO BLU PRFRM FBRC (DRAPE/PACKS/SHEETS/OR TOWEL) ×4 IMPLANT
GOWN SURG XL STD LGTH L3 HKLP CLSR RGLN SLEEVE TWL STRL LF  DISP GRN AERO BLU PRFRM FBRC (DRAPE/PACKS/SHEETS/OR TOWEL) ×4 IMPLANT
NEEDLE HYPO  18GA 1.5IN REG WL BD PRCSNGL POLYPROP REG BVL LL HUB CLR CD DEHP-FR STRL LF  DISP (MED SURG SUPPLIES) ×2 IMPLANT
NEEDLE HYPO  23GA 1.5IN TW PRCSNGL SS POLYPROP REG BVL LL HUB DEHP-FR TRQS STRL LF  DISP (MED SURG SUPPLIES) ×4 IMPLANT
PACK SURG UBR BCK TBL CVR ZN REINF MAYO STAND CVR STRL DISP 90X44IN 54X23IN LF (CUSTOM TRAYS & PACK) ×2 IMPLANT
PEN SURG MRKNG DISP RLR LBL STRL LF  6IN (MED SURG SUPPLIES) ×2 IMPLANT
PENCIL SMOKEVAC COAT PSHBTN LF (MED SURG SUPPLIES) ×2 IMPLANT
SOL IRRG 0.9% NACL 1000ML PLASTIC PR BTL ISTNC N-PYRG STRL LF (MEDICATIONS/SOLUTIONS) ×2 IMPLANT
SPONGE GAUZE 4X4IN AV GZ CLU COTTON ABS NWVN POSTOP LF  STRL DISP (WOUND CARE SUPPLY) ×2 IMPLANT
SPONGE SURG 4X4IN 16 PLY XRY DETECT COTTON STRL LF  DISP (WOUND CARE SUPPLY) ×2 IMPLANT
STAPLER SKIN 4.1X6.5MM 35 W STPL CART LF  APS U DISP CLR SS PLASTIC (WOUND CARE SUPPLY) ×2 IMPLANT
SUTURE 2-0 CT2 VICRYL 27IN VIOL BRD COAT ABS (SUTURE/WOUND CLOSURE) ×2 IMPLANT
SUTURE 5-0 C-13 POLYSRB 30IN UNDYED BRD COAT ABS (SUTURE/WOUND CLOSURE) ×2 IMPLANT
SYRINGE LL 10ML LF  STRL GRAD N-PYRG DEHP-FR PVC FREE MED DISP (MED SURG SUPPLIES) ×4 IMPLANT
TOWEL 24X16IN COTTON BLU DISP SURG STRL LF (DRAPE/PACKS/SHEETS/OR TOWEL) ×4 IMPLANT

## 2024-06-09 NOTE — Nurses Notes (Signed)
 No belongings with patient at time of transfer to pre op. All belongings went with the patient's husband.

## 2024-06-09 NOTE — Nurses Notes (Signed)
 PT TRANSPORTED TO DAY SURGERY 4 VIA STRETCHER, BY RN.  PT VSS, AWAKE, ALERT.  PT CARE ENDORSED TO ANNABELLA HARVEY, RN AT [phone].  SURGICAL SITES ASSESSED.    VS:  BP- 168/78 (105)  HR- 89  RR- 17  O2- 97% RA  T- 97.18F    SURGICAL SITE:  NOSE- SUTURES AND OINTMENT  NO DRAINAGE  R THIGH- GAUZE SILK TAPE  DRESSING CLEAN, DRY, INTACT  NO DRAINAGE NOTED    IV SITE:  22 G L HAND  LR INFUSING  DRESSING CLEAN, DRY, INTACT  NO DRAINAGE NOTED

## 2024-06-09 NOTE — Anesthesia Postprocedure Evaluation (Signed)
 Anesthesia Post Op Evaluation    Patient: Leslie Hall  Procedure(s):  WIDE LOCAL EXCISION RIGHT THIGH LESION; BIOPSY OF LEFT NASAL LESION  N/A    Last Vitals:Temperature: 36.3 C (97.4 F) (06/09/24 0923)  Heart Rate: 94 (06/09/24 0923)  BP (Non-Invasive): 124/70 (06/09/24 0923)  Respiratory Rate: 16 (06/09/24 0923)  SpO2: 98 % (06/09/24 0923)    No notable events documented.    Patient is sufficiently recovered from the effects of anesthesia to participate in the evaluation and has returned to their pre-procedure level.  Patient location during evaluation: PACU       Patient participation: complete - patient participated  Level of consciousness: awake and alert and responsive to verbal stimuli    Pain management: adequate  Airway patency: patent    Anesthetic complications: no  Cardiovascular status: acceptable  Respiratory status: acceptable  Hydration status: acceptable  Patient post-procedure temperature: Pt Normothermic   PONV Status: Absent

## 2024-06-09 NOTE — OR PostOp (Signed)
Dr. Duremdes at bedside speaking to patient.

## 2024-06-09 NOTE — Discharge Instructions (Addendum)
 Instructions for incision/wound care: Clean Incision with Soap and Water   Change Dressing Daily and as Needed if Becomes Wet or Soiled            Cleaned facial incision with soap and water  and apply antibiotic ointment to the area daily  Clean right thigh incision with soap and water  and apply antibiotic ointment to the area daily and cover with a light gauze    Follow up appointment is sept. 9th at 3 p.m.     Dr. Lynnette will assess and remove the staples to your right hip at your follow up appointment. The sutures present to the left nare will dissolve on their own.     Stay out of any body of water  until follow up appointment including bath, river, pools, ocean ,and creek.     Call 911 if chest pain or difficulty breathing.

## 2024-06-09 NOTE — H&P (Signed)
 [facility]  General Surgery  History and Physical    Date of Service:  06/09/2024  Leslie Hall, 78 y.o. female  Date of Admission:  06/09/2024  Date of Birth:  [birth year]  PCP: Manus Endo, MD    Reason for admission:  Excisional biopsy of left nasolabial lesion and right thigh lesion    HPI:  Leslie Hall is a 78 y.o. White female who is admitted for Benign skin lesion of thigh [L98.9]  Nasal lesion [J34.89] .    Leslie Hall presents today for evaluation and management of an irritated raised right thigh lesion which the patient has had for 3 years.  It chronically gets irritated and bleeds.  She also has a small lesion almost send her nose along the crease that needs biopsied.     Negative diabetes, blood thinner        Review of the result(s) of each unique test:  Patient underwent diagnostic testing ( none ) prior to this dates visit.  I have personally reviewed the results and that serves as a component of the medical decision making for this encounter        Review of prior external note(s) from each unique source:  Patients referral to this office including a recent assessment by the referring provider.  This was reviewed by me for this unique office visit for the indication and intent of the referral as well as any pertinent medical or surgical history relevant to the patients independent evaluation by me today.    Past Medical History:   Diagnosis Date    Hypertension     Kidney stones     Mixed hyperlipidemia     Pneumonia     Skin cancer     forehead      Past Surgical History:   Procedure Laterality Date    HEEL SPUR SURGERY      HX BREAST BIOPSY Bilateral     yrs ago -    HX HYSTERECTOMY      HX TRIGGER FINGER RELEASE      NEPHROSTOMY      SKIN CANCER EXCISION      forehead    URETERAL STENT PLACEMENT        Social History[1]    Family Medical History:       Problem Relation (Age of Onset)    Breast Cancer Maternal Aunt, Maternal Aunt    No Known Problems Mother, Father,  Sister, Brother, Maternal Grandmother, Maternal Grandfather, Paternal Grandmother, Paternal Grandfather, Daughter, Son, Maternal Uncle, Paternal Aunt, Paternal Uncle, Other           Medications Prior to Admission       Prescriptions    amLODIPine (NORVASC) 5 mg Oral Tablet    Take 1 Tablet (5 mg total) by mouth Daily    ascorbic acid/vitamin E/biotin (HAIR, SKIN, NAILS WITH BIOTIN ORAL)    Take 2 Tablets by mouth Daily    aspirin (ECOTRIN) 81 mg Oral Tablet, Delayed Release (E.C.)    Take by mouth Once a day    chlorphen/pseudoeph/ibuprofen  (ADVIL  ALLERGY SINUS ORAL)    Take 2 Tablets by mouth Once a day    Coenzyme Q10 (CO Q-10) 10 mg Oral Capsule    Take 1 Capsule (10 mg total) by mouth Once a day    cranberry-vit C-mannose-herb 1 gram-120 mg- 1 gram/30 mL Oral Liquid    Take 45 mL by mouth Daily    estradioL  (ESTRACE ) 0.01 % (0.1 mg/gram) Vaginal  Cream    apply pea-sized amount to urethral opening nightly for 1 month then Monday-Wednesday-Friday    hypochlorous acid/sodium chlor (THERA TEARS STERILID EXT)    Instill 1 Drop into both eyes Once a day    montelukast (SINGULAIR) 10 mg Oral Tablet    Take 1 Tablet (10 mg total) by mouth Daily    vit C/E/Zn/coppr/lutein/zeaxan (EYE HEALTH AREDS-2 ORAL)    Take 2 Tablets by mouth Daily           Allergies[2]       Patient Vitals for the past 24 hrs:   Temp Pulse Resp SpO2 Height Weight   06/09/24 0727 37 C (98.6 F) 84 17 98 % 1.702 m (5' 7) 61.2 kg (135 lb)          General: appropriate for age. in no acute distress.    Vital signs are present above and have been reviewed by me     HEENT: Atraumatic, Normocephalic. PERRLA, EOMI. Nose clear. Throat clear.    Small nodular lesion on the left side of the nose along the alar crease.     Lungs: Nonlabored breathing with symmetric expansion.  Clear to auscultation bilaterally    Heart:Regular wth respect to rate and rythmn.    Abdomen:Soft. Nontender. Nondistended and benign    Extremities:  Grossly normal with good range  of motion and no major deformities. Irritated raised right thigh lesion highly suspicious for skin cancer      Neuro:  Grossly normal motor and sensory function. CN's II through XII intact.    Psychiatric: Alert and oriented to person, place, and time. affect appropriate    Laboratory Data:     No results found for any visits on 06/09/24 (from the past 24 hours).    Imaging Studies:    No orders to display        Assessment/Plan:      ICD-10-CM     1. Primary cancer of skin of right thigh  C44.702         2. External nasal lesion  L98.9         Wide local excision of right thigh lesion and biopsy of left nasal lesion scheduled for 06/09/2024     Anesthesia local MAC.     Risks, benefits, indications, complications of elective excision of these lesion(s) were discussed in detail including but not limited to bleeding, infection, reaction to anesthesia, need for further surgery and non-cosmetic result of the scar.   All questions were answered to the patient`s satisfaction with informed consent clearly obtained.    This note was partially created using voice recognition software and is inherently subject to errors including those of syntax and sound alike  substitutions which may escape proof reading. In such instances, original meaning may be extrapolated by contextual derivation.    Prem Coykendall B Aalyssa Elderkin, MD, MBA, FACS         [1]   Social History  Tobacco Use    Smoking status: Never    Smokeless tobacco: Never   Vaping Use    Vaping status: Never Used   Substance Use Topics    Alcohol use: Never    Drug use: Never   [2]   Allergies  Allergen Reactions    Morphine   Other Adverse Reaction (Add comment)     BP drop shaking     Latex Rash    Amitriptyline  Other Adverse Reaction (Add comment)     Heart issues  Amoxicillin  Other Adverse Reaction (Add comment)     Yeast infection    Ciprofloxacin  Other Adverse Reaction (Add comment)     Unsure      Me-Prednisolone Sod Succ Lyoph [Methylprednisolone Sodium Succ]  Other  Adverse Reaction (Add comment)     Vertigo      Metoprolol  Other Adverse Reaction (Add comment)     insomnia    Mobic [Meloxicam]  Other Adverse Reaction (Add comment)     Gi isssues    Naprosyn [Naproxen]  Other Adverse Reaction (Add comment)     Effects her ears    Niacin  Other Adverse Reaction (Add comment)     Burning ears      Penicillins  Other Adverse Reaction (Add comment)     Yeast infections    Statins-Hmg-Coa Reductase Inhibitors  Other Adverse Reaction (Add comment)     Heart pain    Sugar [Dextrose ]      Tongue swelling and body pain    Tylenol  [Acetaminophen ]  Other Adverse Reaction (Add comment)     Gi issues    Cefdinir Itching

## 2024-06-09 NOTE — OR Surgeon (Signed)
 [facility]      Patient Name: Leslie Hall, Leslie Hall Number: [id number]  Date of Service: 06/09/2024   Date of Birth: Mar 20, 1946      Pre-Operative Diagnosis: Benign skin lesion of thigh [L98.9]  Nasal lesion [J34.89]     Post-Operative Diagnosis: Benign skin lesion of thigh and Nasal lesion    Procedure(s)/Description:  WIDE LOCAL EXCISION RIGHT THIGH LESION; BIOPSY OF LEFT NASAL LESION: 11400 (CPT)    N/A: 21930 (CPT)       Attending Surgeon: Leslie Hall Denise, Leslie Hall     Anesthesia:  Anesthesiologist: Leslie Agent, Leslie Hall  Leslie Hall: Leslie Fulling, Leslie Hall    Anesthesia Type: .Monitored Anesthesia Care (MAC) / Block     First Assist: None     Estimated Blood Loss:  * No blood loss amount entered *      Specimens Removed:   ID Type Source Tests Collected by Time Destination   1 : LEFT NASAL LESION Tissue Nose SURGICAL PATHOLOGY SPECIMEN Leslie Ditton Hall, Leslie Hall 06/09/2024 0908    2 : RIGHT THIGH LESION Tissue Leg SURGICAL PATHOLOGY SPECIMEN Leslie Wesche Hall, Leslie Hall 06/09/2024 0909       Order Name Source Comment Collection Info Order Time   SURGICAL PATHOLOGY SPECIMEN Nose Pre-op diagnosis:  Benign skin lesion of thigh [L98.9]  Nasal lesion [J34.89] Collected By: Leslie Leslie KATHEE, Leslie Hall 06/09/2024  9:10 AM     Release to patient   Manual release only          Reason for preventing immediate release   Reasonable likelihood of causing patient harm             The patient was brought to the operating suite.   The face and right thigh were prepped and draped in usual sterile fashion followed by injection of local anesthesia.  The right thigh was injected with Naropin  while the left face lesion was injected with 1% xylocaine  with epinephrine ..   The area of concern on the right thigh was then excised with visibly clear margins with hemostasis ensured.  The preoperative size of the lesion was to centimeters.  The wound was closed with 2-0 Polysorb suture for the dermal/deep layer.  The skin edges were approximated skin  staples.  Antibiotic ointment was applied and sterile dressings were applied as well.    Attention was then turned to the left nasolabial lesion which was excised using a smaller elliptical incision and sent to pathology for examination.  The skin edges were approximated with interrupted 5 0 Polysorb suture on diet.  Antibiotic ointment was applied. A sterile pressure dressing was applied.     The patient tolerated the procedure well and was taken to the recovery room in stable condition.    Leslie Chittenden Hall. Rionna Feltes, Leslie Hall, Leslie Hall, Leslie Hall  [facility] -General Surgery

## 2024-06-09 NOTE — Nurses Notes (Signed)
 AVS reviewed with the patient and her daughter at bedside including wound care as ordered, follow up appointment, and instructions on post anes care at home. Both parties verbalized understanding of AVS. Gauze pad, applicators, and polysporin  antibiotic sent home with the patient. The patient verbalized understanding of how and when to change the dressing, understanding of sutures on the nose dissolving, and the staples requiring re assessment and removal at her f/u appointment. Patient escorted to her daughter's vehicle via wheel chair by volunteer cathy.

## 2024-06-09 NOTE — Anesthesia Transfer of Care (Signed)
 ANESTHESIA TRANSFER OF CARE   Leslie Hall is a 78 y.o. ,female, Weight: 61.2 kg (135 lb)   had Procedure(s):  WIDE LOCAL EXCISION RIGHT THIGH LESION; BIOPSY OF LEFT NASAL LESION  N/A  performed  06/09/24   Primary Service: Amaryllis KATHEE Denise, MD    Past Medical History:   Diagnosis Date   . Hypertension    . Kidney stones    . Mixed hyperlipidemia    . Pneumonia    . Skin cancer     forehead      Allergy History as of 06/09/24       PENICILLINS         Noted Status Severity Type Reaction    09/26/22 0641 Celestia Slough, LPN 95/95/76 Active    Other Adverse Reaction (Add comment)    Comments: Yeast infections     01/23/22 1136 Crewey, [place], LPN 95/95/76 Active                 MELOXICAM         Noted Status Severity Type Reaction    09/26/22 0642 Celestia Slough, LPN 95/95/76 Active    Other Adverse Reaction (Add comment)    Comments: Gi isssues     01/23/22 1136 Crewey, Lucie, LPN 95/95/76 Active                 NIACIN         Noted Status Severity Type Reaction    09/26/22 0642 Celestia Slough, LPN 95/95/76 Active    Other Adverse Reaction (Add comment)    Comments: Burning ears       01/23/22 1137 Crewey, Lucie, LPN 95/95/76 Active                 LATEX         Noted Status Severity Type Reaction    09/12/22 0758 Viviann Sniff, RN 07/28/22 Active Medium  Rash    07/28/22 1439 Dillow-Price, Harlene, RN 07/28/22 Active                 CEFDINIR         Noted Status Severity Type Reaction    09/07/22 1546 Stacia Planas, LPN 88/82/76 Active Low  Itching              ACETAMINOPHEN          Noted Status Severity Type Reaction    09/26/22 0642 Celestia Slough, LPN 88/82/76 Active    Other Adverse Reaction (Add comment)    Comments: Gi issues     09/07/22 1547 Stacia Planas, LPN 88/82/76 Active                 AMITRIPTYLINE         Noted Status Severity Type Reaction    09/26/22 0641 Celestia Slough, LPN 88/82/76 Active    Other Adverse Reaction (Add comment)    Comments: Heart issues     09/07/22 1547  Stacia Planas, LPN 88/82/76 Active                 MORPHINE          Noted Status Severity Type Reaction    09/12/22 0709 Sonda Greener, RN 09/07/22 Active High   Other Adverse Reaction (Add comment)    Comments: BP drop shaking      09/12/22 0709 Sonda Greener, RN 09/07/22 Active    Other Adverse Reaction (Add comment)    Comments: BP drop shaking      09/07/22 1548  Stacia Planas, LPN 88/82/76 Active   Malignant Hyperthermia              CIPROFLOXACIN         Noted Status Severity Type Reaction    09/26/22 0641 Celestia Slough, LPN 88/82/76 Active    Other Adverse Reaction (Add comment)    Comments: Unsure       09/07/22 1549 Stacia Planas, LPN 88/82/76 Active                 METHYLPREDNISOLONE SODIUM SUCC         Noted Status Severity Type Reaction    06/13/23 1122 Acacia Villas, [place], [place] 06/13/23 Active    Other Adverse Reaction (Add comment)    Comments: Vertigo                 DEXTROSE          Noted Status Severity Type Reaction    06/08/24 0920 Heyward Sailors, RN 06/08/24 Active       Comments: Tongue swelling and body pain               METOPROLOL         Noted Status Severity Type Reaction    06/09/24 0713 Derinda Riggs, RN 06/09/24 Active    Other Adverse Reaction (Add comment)    Comments: insomnia               AMOXICILLIN         Noted Status Severity Type Reaction    06/09/24 0714 Derinda Riggs, RN 06/09/24 Active    Other Adverse Reaction (Add comment)    Comments: Yeast infection               NAPROXEN         Noted Status Severity Type Reaction    06/09/24 0714 Derinda Riggs, RN 06/09/24 Active    Other Adverse Reaction (Add comment)    Comments: Effects her ears               STATINS-HMG-COA REDUCTASE INHIBITORS         Noted Status Severity Type Reaction    06/09/24 0716 Derinda Riggs, RN 06/09/24 Active    Other Adverse Reaction (Add comment)    Comments: Heart pain                   I completed my transfer of care / handoff to the receiving personnel during which we discussed:  Airway, All  key/critical aspects of case discussed, Analgesia, Fluids/Product, Gave opportunity for questions and acknowledgement of understanding and PMHx  Report given to: Claudene Race, RN    Post Location: PACU                                                           Last OR Temp: Temperature: 36.3 C (97.4 F)  ABG:  POTASSIUM   Date Value Ref Range Status   05/28/2024 4.0 3.5 - 5.1 mmol/L Final     KETONES   Date Value Ref Range Status   12/16/2023 Negative Negative, Trace mg/dL Final     KLEBSIELLA OXYTOCA   Date Value Ref Range Status   07/28/2022 Not Detected Not Detected Final     KLEBSIELLA PNEUMONIAE   Date Value Ref Range Status   07/28/2022  Not Detected Not Detected Final     KPC   Date Value Ref Range Status   07/28/2022 Not Detected Not Detected, N/A Final     CALCIUM   Date Value Ref Range Status   05/28/2024 10.2 8.6 - 10.3 mg/dL Final     CALCIUM OXALATE CRYSTALS   Date Value Ref Range Status   09/03/2022 Rare (A) (none) /hpf Final     Calculated P Axis   Date Value Ref Range Status   07/25/2022 75 degrees Final     Calculated R Axis   Date Value Ref Range Status   05/28/2024 31 degrees Final     Calculated T Axis   Date Value Ref Range Status   05/28/2024 34 degrees Final     Airway:* No LDAs found *  Blood pressure 124/70, pulse 94, temperature 36.3 C (97.4 F), resp. rate 16, height 1.702 m (5' 7), weight 61.2 kg (135 lb), SpO2 98%.

## 2024-06-10 ENCOUNTER — Telehealth (INDEPENDENT_AMBULATORY_CARE_PROVIDER_SITE_OTHER): Payer: Self-pay | Admitting: Surgery

## 2024-06-10 DIAGNOSIS — C44311 Basal cell carcinoma of skin of nose: Secondary | ICD-10-CM

## 2024-06-10 DIAGNOSIS — C44712 Basal cell carcinoma of skin of right lower limb, including hip: Secondary | ICD-10-CM

## 2024-06-10 LAB — SURGICAL PATHOLOGY SPECIMEN

## 2024-06-30 ENCOUNTER — Encounter (INDEPENDENT_AMBULATORY_CARE_PROVIDER_SITE_OTHER): Payer: Self-pay | Admitting: Surgery

## 2024-06-30 ENCOUNTER — Other Ambulatory Visit: Payer: Self-pay

## 2024-06-30 VITALS — BP 151/78 | HR 88 | Temp 98.1°F | Resp 18 | Ht 67.0 in | Wt 135.0 lb

## 2024-06-30 DIAGNOSIS — Z48817 Encounter for surgical aftercare following surgery on the skin and subcutaneous tissue: Secondary | ICD-10-CM

## 2024-06-30 DIAGNOSIS — Z9889 Other specified postprocedural states: Secondary | ICD-10-CM

## 2024-06-30 NOTE — Progress Notes (Signed)
 GENERAL SURGERY, [facility]  [address]  [place] [address]    Post-Op/Procedure Note    Name: Leslie Hall MRN:  [id number]   Date:   06/30/2024 DOB:  1946-10-08 (78 y.o.)               Subjective:  Leslie Hall presents 3 weeks following a excision of lesion from the right thigh and left peri alar region.   She is eating a regular diet without difficulty.  No nausea, vomiting, or abdominal pain.  Bowel movements are Normal.    The patient is not having any pain..  No fevers.     Objective:  BP (!) 151/78   Pulse 88   Temp 36.7 C (98.1 F)   Resp 18   Ht 1.702 m (5' 7)   Wt 61.2 kg (135 lb)   SpO2 98%   BMI 21.14 kg/m       General:  alert, cooperative, no distress, appears stated age  Abdomen: soft  Incision:  healing well; the skin staples were removed from the right thigh and the incision remained intact.  No evidence of infection.  The sutures were removed from the left perianal area.    Path report:  The patient is cell carcinoma    Assessment/Plan:  Doing well postoperatively.  Activity:  The patient was instructed to continue to apply antibiotic ointment to both of the areas.  She also stated that she noticed a red spot on her leg for which I told her put antibiotic ointment on the area for the next few weeks.  She is to notify me if it does not go away.  Return if symptoms worsen or fail to improve.    Amaryllis KATHEE Denise, MD

## 2024-11-04 ENCOUNTER — Other Ambulatory Visit: Payer: Self-pay

## 2024-11-04 ENCOUNTER — Encounter (INDEPENDENT_AMBULATORY_CARE_PROVIDER_SITE_OTHER): Payer: Self-pay | Admitting: Physician Assistant

## 2024-11-04 ENCOUNTER — Ambulatory Visit (INDEPENDENT_AMBULATORY_CARE_PROVIDER_SITE_OTHER): Payer: Self-pay | Admitting: Physician Assistant

## 2024-11-04 VITALS — BP 164/93 | HR 92 | Ht 67.0 in | Wt 138.0 lb

## 2024-11-04 DIAGNOSIS — N390 Urinary tract infection, site not specified: Secondary | ICD-10-CM

## 2024-11-04 DIAGNOSIS — N200 Calculus of kidney: Secondary | ICD-10-CM

## 2024-11-04 DIAGNOSIS — Z87440 Personal history of urinary (tract) infections: Secondary | ICD-10-CM

## 2024-11-04 NOTE — Progress Notes (Signed)
 UROLOGY, [facility]  [address] [place] [address]    Progress Note    Name: Leslie Hall MRN:  [id number]   Date: 11/04/2024 DOB:  1946/01/12 (78 y.o.)             Chief Complaint: Follow Up (No issues /Hasn't had a UTI in 6 to 7 months)  Subjective   Subjective:   Leslie Hall is a pleasant 79 year old female who recently experienced an episode of urosepsis and obstructive uropathy secondary to a right distal ureteral calculus requiring transfer to [facility] with a nephrostomy tube 2023.     She presents for follow up of history of nephrolithiasis and UTIs.  She continues d-mannose and cranberry gummies daily.  She is now using premarin vaginal cream M-W-F. No UTIs in interval. She denies fever, chills, suprapubic pain, nausea, vomiting, flank pain, dysuria, or hematuria.                   Objective   Objective :  BP (!) 164/93   Pulse 92   Ht 1.702 m (5' 7)   Wt 62.6 kg (138 lb)   BMI 21.61 kg/m       Gen: NAD, alert  Pulm: unlabored at rest  CV: palpable pulses  Abd: soft, Nt/ND  GU: no suprapubic tenderness, no CVAT        Data reviewed:    Current Outpatient Medications   Medication Sig    amLODIPine (NORVASC) 5 mg Oral Tablet Take 1 Tablet (5 mg total) by mouth Daily    ascorbic acid/vitamin E/biotin (HAIR, SKIN, NAILS WITH BIOTIN ORAL) Take 2 Tablets by mouth Daily    aspirin (ECOTRIN) 81 mg Oral Tablet, Delayed Release (E.C.) Take by mouth Once a day    chlorphen/pseudoeph/ibuprofen  (ADVIL  ALLERGY SINUS ORAL) Take 2 Tablets by mouth Once a day    Coenzyme Q10 (CO Q-10) 10 mg Oral Capsule Take 1 Capsule (10 mg total) by mouth Once a day    cranberry-vit C-mannose-herb 1 gram-120 mg- 1 gram/30 mL Oral Liquid Take 45 mL by mouth Daily    estradioL  (ESTRACE ) 0.01 % (0.1 mg/gram) Vaginal Cream apply pea-sized amount to urethral opening nightly for 1 month then Monday-Wednesday-Friday    hypochlorous acid/sodium chlor (THERA TEARS STERILID EXT) Instill 1 Drop into both eyes Once a  day    montelukast (SINGULAIR) 10 mg Oral Tablet Take 1 Tablet (10 mg total) by mouth Daily    traMADoL  (ULTRAM ) 50 mg Oral Tablet Take 1 Tablet (50 mg total) by mouth Every 6 hours as needed for Pain    vit C/E/Zn/coppr/lutein/zeaxan (EYE HEALTH AREDS-2 ORAL) Take 2 Tablets by mouth Daily        Assessment/Plan  Problem List Items Addressed This Visit    None      History of bilateral non-obstructing nephrolithiasis, 6mm right, 7mm left; s/p left URS on 09/12/2022 & right URS on 09/26/2022    History of right distal ureterolithiasis, 5mm, status post right nephrostomy tube placement on 07/29/2022 due to septic shock 2/2 urosepsis s/p right URS and stent removal on 09/12/2022    Bilateral nephrolithiasis,right 4mm and left 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  We also reviewed the recent 2014 American Urological Association guideline on Medical Management of Kidney Stones and specifically discussed dietary therapies including:  Increase fluid intake to achieve urine output of at least 2.5 liters daily  Limit sodium intake to no more than 100 mEq (2,300 mg) per day  Consume 1,000-1,200 mg of dietary calcium per day  Limit oxalate-rich foods (beets, spinach, rhubarb, nuts, chocolate)  Limit non-dairy animal protein  Encouraged increased fruit and vegetable intake    Recurrent UTIs  I discussed the epidemiology, pathogenesis, and prevention of recurrent acute uncomplicated cystitis and pyelonephritis of recurrent urinary tract infections with patient  Topical estrogen for postmenopausal women as a means to increase the prevalence of lactobacilli and decrease in E. coli vaginal colonization  Refill Cranberry supplement daily  Refill D-mannose supplement daily  Refill Intravaginal topical conjugated estrogen cream (Premarin) apply pea-sized amount to urethral  opening nightly for 1 month then Monday-Wednesday-Friday      A combined total of 30 minutes were spent preparing to see the patient, reviewing previous records, ordering tests/medications/procedures, documenting the clinical encounter as well as performing a medically appropriate evaluation and independently interpreting results and communicating them to the patient/family/caregiver as specifically outlined above in the impression and plan.

## 2024-11-13 ENCOUNTER — Other Ambulatory Visit (HOSPITAL_COMMUNITY): Payer: Self-pay | Admitting: FAMILY PRACTICE

## 2024-11-13 DIAGNOSIS — Z1239 Encounter for other screening for malignant neoplasm of breast: Secondary | ICD-10-CM

## 2024-11-19 ENCOUNTER — Emergency Department (HOSPITAL_COMMUNITY)

## 2024-11-19 ENCOUNTER — Other Ambulatory Visit: Payer: Self-pay

## 2024-11-19 ENCOUNTER — Emergency Department
Admission: EM | Admit: 2024-11-19 | Discharge: 2024-11-19 | Disposition: A | Discharge status: Home / Self Care | Attending: Family Medicine | Admitting: Family Medicine

## 2024-11-19 ENCOUNTER — Encounter (HOSPITAL_COMMUNITY): Payer: Self-pay | Admitting: Family Medicine

## 2024-11-19 DIAGNOSIS — Z87442 Personal history of urinary calculi: Secondary | ICD-10-CM | POA: Insufficient documentation

## 2024-11-19 DIAGNOSIS — Z79899 Other long term (current) drug therapy: Secondary | ICD-10-CM | POA: Insufficient documentation

## 2024-11-19 DIAGNOSIS — R55 Syncope and collapse: Secondary | ICD-10-CM | POA: Insufficient documentation

## 2024-11-19 DIAGNOSIS — I10 Essential (primary) hypertension: Secondary | ICD-10-CM | POA: Insufficient documentation

## 2024-11-19 DIAGNOSIS — R42 Dizziness and giddiness: Secondary | ICD-10-CM | POA: Insufficient documentation

## 2024-11-19 LAB — SCAN DIFFERENTIAL
PLATELET MORPHOLOGY COMMENT: NORMAL
RBC MORPHOLOGY COMMENT: NORMAL
SCHISTOCYTES: ABSENT

## 2024-11-19 LAB — CBC WITH DIFF
BASOPHIL #: 0.1 10*3/uL (ref 0.00–0.10)
BASOPHIL %: 1 % (ref 0–1)
EOSINOPHIL #: 0.1 10*3/uL (ref 0.00–0.50)
EOSINOPHIL %: 1 % (ref 1–7)
HCT: 46.5 % — ABNORMAL HIGH (ref 31.2–41.9)
HGB: 16.1 g/dL — ABNORMAL HIGH (ref 10.9–14.3)
LYMPHOCYTE #: 1.5 10*3/uL (ref 1.10–3.10)
LYMPHOCYTE %: 14 % — ABNORMAL LOW (ref 16–46)
MCH: 30.2 pg (ref 24.7–32.8)
MCHC: 34.5 g/dL (ref 32.3–35.6)
MCV: 87.4 fL (ref 75.5–95.3)
MONOCYTE #: 0.7 10*3/uL (ref 0.20–0.90)
MONOCYTE %: 6 % (ref 4–11)
MPV: 8.4 fL (ref 7.9–10.8)
NEUTROPHIL #: 8.6 10*3/uL — ABNORMAL HIGH (ref 1.90–8.20)
NEUTROPHIL %: 79 % — ABNORMAL HIGH (ref 43–77)
PLATELETS: 322 10*3/uL (ref 140–440)
RBC: 5.33 10*6/uL — ABNORMAL HIGH (ref 3.63–4.92)
RDW: 13.6 % (ref 12.3–17.7)
WBC: 10.9 10*3/uL (ref 3.8–11.8)

## 2024-11-19 LAB — COMPREHENSIVE METABOLIC PANEL, NON-FASTING
ALBUMIN/GLOBULIN RATIO: 1.6 — ABNORMAL HIGH (ref 0.8–1.4)
ALBUMIN: 4.7 g/dL (ref 3.5–5.7)
ALKALINE PHOSPHATASE: 101 U/L (ref 34–104)
ALT (SGPT): 16 U/L (ref 7–52)
ANION GAP: 10 mmol/L (ref 4–13)
AST (SGOT): 19 U/L (ref 13–39)
BILIRUBIN TOTAL: 0.5 mg/dL (ref 0.3–1.0)
BUN/CREA RATIO: 19 (ref 6–22)
BUN: 21 mg/dL (ref 7–25)
CALCIUM, CORRECTED: 9.6 mg/dL (ref 8.9–10.8)
CALCIUM: 10.2 mg/dL (ref 8.6–10.3)
CHLORIDE: 108 mmol/L — ABNORMAL HIGH (ref 98–107)
CO2 TOTAL: 19 mmol/L — ABNORMAL LOW (ref 21–31)
CREATININE: 1.11 mg/dL (ref 0.60–1.30)
ESTIMATED GFR: 51 mL/min/{1.73_m2} — ABNORMAL LOW (ref >59)
GLOBULIN: 3 (ref 2.0–3.5)
GLUCOSE: 117 mg/dL — ABNORMAL HIGH (ref 74–109)
OSMOLALITY, CALCULATED: 278 mosm/kg (ref 270–290)
POTASSIUM: 5 mmol/L (ref 3.5–5.1)
PROTEIN TOTAL: 7.7 g/dL (ref 6.4–8.9)
SODIUM: 137 mmol/L (ref 136–145)

## 2024-11-19 LAB — TROPONIN-I
TROPONIN I: 6 ng/L (ref <15)
TROPONIN I: 7 ng/L (ref <15)
TROPONIN I: 5 ng/L (ref <15)

## 2024-11-19 LAB — PT/INR
INR: 0.96 (ref 0.84–1.10)
PROTHROMBIN TIME: 10.8 s (ref 9.8–12.7)

## 2024-11-19 LAB — MAGNESIUM: MAGNESIUM: 2.3 mg/dL (ref 1.9–2.7)

## 2024-11-19 LAB — PTT (PARTIAL THROMBOPLASTIN TIME): APTT: 28.7 s (ref 25.0–38.0)

## 2024-11-19 MED ORDER — SODIUM CHLORIDE 0.9 % IV BOLUS
1000.0000 mL | INJECTION | Status: AC
  Administered 2024-11-19: 1000 mL via INTRAVENOUS
  Administered 2024-11-19: 0 mL via INTRAVENOUS

## 2024-11-19 MED ORDER — CLONIDINE HCL 0.1 MG TABLET
0.1000 mg | ORAL_TABLET | ORAL | Status: AC
  Administered 2024-11-19: 0.1 mg via ORAL

## 2024-11-19 MED ORDER — CLONIDINE HCL 0.1 MG TABLET
ORAL_TABLET | ORAL | Status: AC
  Filled 2024-11-19: qty 1

## 2024-11-19 MED ORDER — CLONIDINE HCL 0.1 MG TABLET: 0.1000 mg | ORAL_TABLET | Freq: Two times a day (BID) | ORAL | 0 refills | Status: AC | PRN

## 2024-11-19 NOTE — ED APP Handoff Note (Signed)
 [facility] - Emergency Department  Emergency Department  Provider in Triage Note    Name: Ulah Olmo  Age: 79 y.o.  Gender: female     Subjective:   Jaslyn Bansal is a 79 y.o. female who presents with complaint of Dizziness  The patient presents to the ER today with reports of lightheadedness and near syncope that began this afternoon. Her primary care provider increased her blood pressure medication last week. She contacted their office today and they instructed her to come to the ER today for evaluation.    Objective:   Filed Vitals:    11/19/24 1638   BP: (!) 175/87   Pulse: (!) 103   Resp: 18   Temp: 36.7 C (98 F)   SpO2: 99%      Vitals are also documented in the EMR.  Focused Physical Exam shows an adult, well developed female in no acute distress. Neurologically intact. PERRLA intact. Face is symmetric. Grip strengths are equal.     Assessment:  A medical screening exam was completed.  This patient is a 79 y.o. female with Dizziness  .    Plan:  Please see initial orders and work-up in the EMR.  This is to be continued with full evaluation in the main Emergency Department.     No current facility-administered medications for this encounter.     Results for orders placed or performed during the hospital encounter of 11/19/24 (from the past 24 hours)   CBC/DIFF    Collection Time: 11/19/24  4:44 PM    Narrative    The following orders were created for panel order CBC/DIFF.  Procedure                               Abnormality         Status                     ---------                               -----------         ------                     CBC WITH IPQQ[[id number]]                                                                 Please view results for these tests on the individual orders.

## 2024-11-19 NOTE — ED Nurses Note (Signed)

## 2024-11-19 NOTE — ED Nurses Note (Signed)
 Pt arrived to ED via ambulation. Pt states she was sitting at home earlier and had a near syncopal episode. Pt states she just began taking new hypertensive medications. Pt has been connected to bedside monitoring. Call light and personal belongings are within reach. Spouse at bedside.
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
## 2024-11-19 NOTE — ED Triage Notes (Signed)
 Pt presents to ED with c/o increased dizziness since yesterday. Pt reports she was prescribed an additional blood pressure medication and began taking it yesterday as well. Pt denies chest pain or SOB.

## 2024-11-20 LAB — ECG 12 LEAD
Atrial Rate: 99 {beats}/min
Calculated R Axis: 0 degrees
Calculated T Axis: 85 degrees
PR Interval: 132 ms
QRS Duration: 72 ms
QT Interval: 334 ms
QTC Calculation: 428 ms
Ventricular rate: 99 {beats}/min

## 2024-12-02 ENCOUNTER — Ambulatory Visit

## 2025-11-03 ENCOUNTER — Encounter (INDEPENDENT_AMBULATORY_CARE_PROVIDER_SITE_OTHER): Payer: Self-pay | Admitting: Physician Assistant
